# Patient Record
Sex: Male | Born: 1950 | Race: White | Hispanic: No | Marital: Married | Smoking: Never smoker
Health system: Southern US, Community
[De-identification: ages and names within clinical notes are randomized; demographics above are authoritative.]

## PROBLEM LIST (undated history)

## (undated) DIAGNOSIS — C801 Malignant (primary) neoplasm, unspecified: Secondary | ICD-10-CM

## (undated) DIAGNOSIS — K219 Gastro-esophageal reflux disease without esophagitis: Secondary | ICD-10-CM

## (undated) DIAGNOSIS — E785 Hyperlipidemia, unspecified: Secondary | ICD-10-CM

## (undated) DIAGNOSIS — G4710 Hypersomnia, unspecified: Secondary | ICD-10-CM

## (undated) DIAGNOSIS — G709 Myoneural disorder, unspecified: Secondary | ICD-10-CM

## (undated) DIAGNOSIS — K625 Hemorrhage of anus and rectum: Secondary | ICD-10-CM

## (undated) DIAGNOSIS — G4730 Sleep apnea, unspecified: Secondary | ICD-10-CM

## (undated) DIAGNOSIS — M76899 Other specified enthesopathies of unspecified lower limb, excluding foot: Secondary | ICD-10-CM

## (undated) DIAGNOSIS — D649 Anemia, unspecified: Secondary | ICD-10-CM

## (undated) DIAGNOSIS — M545 Low back pain, unspecified: Secondary | ICD-10-CM

## (undated) HISTORY — PX: LUMBAR DISC SURGERY: SHX700

## (undated) HISTORY — PX: CARDIAC CATHETERIZATION: SHX172

## (undated) HISTORY — PX: APPENDECTOMY: SHX54

## (undated) HISTORY — PX: EPIDIDYMECTOMY: SHX478

## (undated) HISTORY — PX: OTHER SURGICAL HISTORY: SHX169

## (undated) HISTORY — PX: LUMBAR FUSION: SHX111

## (undated) HISTORY — PX: HERNIA REPAIR: SHX51

## (undated) HISTORY — PX: KNEE ARTHROSCOPY: SUR90

## (undated) HISTORY — PX: TONSILLECTOMY: SUR1361

## (undated) HISTORY — PX: VASECTOMY: SHX75

---

## 2004-04-06 ENCOUNTER — Other Ambulatory Visit: Payer: Self-pay

## 2004-05-26 ENCOUNTER — Ambulatory Visit: Payer: Self-pay | Admitting: General Practice

## 2004-06-14 ENCOUNTER — Ambulatory Visit: Payer: Self-pay | Admitting: General Practice

## 2005-06-08 ENCOUNTER — Emergency Department: Payer: Self-pay | Admitting: Emergency Medicine

## 2005-10-19 ENCOUNTER — Other Ambulatory Visit: Payer: Self-pay

## 2005-10-19 ENCOUNTER — Emergency Department: Payer: Self-pay | Admitting: Emergency Medicine

## 2006-09-11 ENCOUNTER — Ambulatory Visit: Payer: Self-pay | Admitting: Gastroenterology

## 2006-09-12 ENCOUNTER — Ambulatory Visit: Payer: Self-pay | Admitting: Gastroenterology

## 2009-02-03 ENCOUNTER — Encounter: Admission: RE | Admit: 2009-02-03 | Discharge: 2009-02-03 | Payer: Self-pay | Admitting: Neurology

## 2009-05-14 ENCOUNTER — Ambulatory Visit: Payer: Self-pay | Admitting: Anesthesiology

## 2009-05-25 ENCOUNTER — Ambulatory Visit: Payer: Self-pay | Admitting: Physician Assistant

## 2009-05-31 ENCOUNTER — Encounter: Payer: Self-pay | Admitting: Anesthesiology

## 2009-06-02 ENCOUNTER — Ambulatory Visit: Payer: Self-pay | Admitting: Anesthesiology

## 2009-06-04 ENCOUNTER — Ambulatory Visit: Payer: Self-pay | Admitting: Physician Assistant

## 2009-06-21 ENCOUNTER — Encounter: Payer: Self-pay | Admitting: Anesthesiology

## 2009-06-23 ENCOUNTER — Ambulatory Visit: Payer: Self-pay | Admitting: Anesthesiology

## 2009-07-07 ENCOUNTER — Ambulatory Visit: Payer: Self-pay | Admitting: Anesthesiology

## 2009-09-01 ENCOUNTER — Ambulatory Visit: Payer: Self-pay | Admitting: Orthopedic Surgery

## 2009-09-09 ENCOUNTER — Ambulatory Visit: Payer: Self-pay | Admitting: Anesthesiology

## 2009-11-02 ENCOUNTER — Ambulatory Visit: Payer: Self-pay | Admitting: Anesthesiology

## 2009-11-17 ENCOUNTER — Ambulatory Visit: Payer: Self-pay | Admitting: Pain Medicine

## 2009-12-16 ENCOUNTER — Ambulatory Visit: Payer: Self-pay | Admitting: Anesthesiology

## 2010-02-14 ENCOUNTER — Ambulatory Visit: Payer: Self-pay | Admitting: Pain Medicine

## 2010-03-07 ENCOUNTER — Ambulatory Visit: Payer: Self-pay | Admitting: Gastroenterology

## 2010-03-08 ENCOUNTER — Ambulatory Visit: Payer: Self-pay | Admitting: Gastroenterology

## 2010-03-09 LAB — PATHOLOGY REPORT

## 2010-03-22 ENCOUNTER — Other Ambulatory Visit: Payer: Self-pay | Admitting: Nurse Practitioner

## 2010-04-18 ENCOUNTER — Ambulatory Visit: Payer: Self-pay | Admitting: Gastroenterology

## 2010-05-13 ENCOUNTER — Ambulatory Visit: Payer: Self-pay | Admitting: Urology

## 2010-05-16 ENCOUNTER — Ambulatory Visit: Payer: Self-pay | Admitting: Urology

## 2010-05-26 ENCOUNTER — Ambulatory Visit: Payer: Self-pay | Admitting: Urology

## 2010-06-07 ENCOUNTER — Inpatient Hospital Stay: Payer: Self-pay | Admitting: Urology

## 2010-08-31 ENCOUNTER — Ambulatory Visit: Payer: Self-pay | Admitting: Radiation Oncology

## 2010-09-21 ENCOUNTER — Ambulatory Visit: Payer: Self-pay | Admitting: Radiation Oncology

## 2010-10-20 ENCOUNTER — Ambulatory Visit: Payer: Self-pay | Admitting: Radiation Oncology

## 2010-11-20 ENCOUNTER — Ambulatory Visit: Payer: Self-pay | Admitting: Radiation Oncology

## 2011-04-10 ENCOUNTER — Ambulatory Visit: Payer: Self-pay | Admitting: Radiation Oncology

## 2011-04-22 ENCOUNTER — Ambulatory Visit: Payer: Self-pay | Admitting: Radiation Oncology

## 2011-07-27 ENCOUNTER — Ambulatory Visit: Payer: Self-pay

## 2012-04-10 ENCOUNTER — Ambulatory Visit: Payer: Self-pay | Admitting: Radiation Oncology

## 2012-04-10 LAB — URINALYSIS, COMPLETE
Bacteria: NONE SEEN
Glucose,UR: NEGATIVE mg/dL (ref 0–75)
Leukocyte Esterase: NEGATIVE
Nitrite: NEGATIVE
Specific Gravity: 1.009 (ref 1.003–1.030)
WBC UR: NONE SEEN /HPF (ref 0–5)

## 2012-04-21 ENCOUNTER — Ambulatory Visit: Payer: Self-pay | Admitting: Radiation Oncology

## 2013-04-10 ENCOUNTER — Ambulatory Visit: Payer: Self-pay | Admitting: Radiation Oncology

## 2013-04-21 ENCOUNTER — Ambulatory Visit: Payer: Self-pay | Admitting: Radiation Oncology

## 2014-04-09 ENCOUNTER — Ambulatory Visit: Payer: Self-pay | Admitting: Radiation Oncology

## 2014-04-21 ENCOUNTER — Ambulatory Visit: Payer: Self-pay | Admitting: Radiation Oncology

## 2014-11-30 ENCOUNTER — Ambulatory Visit
Admit: 2014-11-30 | Disposition: A | Discharge status: Home / Self Care | Payer: Self-pay | Attending: Radiation Oncology | Admitting: Radiation Oncology

## 2015-05-28 ENCOUNTER — Encounter: Payer: Self-pay | Admitting: *Deleted

## 2015-05-31 ENCOUNTER — Encounter
Admission: RE | Disposition: A | Discharge status: Home / Self Care | Payer: Self-pay | Source: Ambulatory Visit | Attending: Gastroenterology

## 2015-05-31 ENCOUNTER — Ambulatory Visit: Payer: Self-pay | Admitting: Radiation Oncology

## 2015-05-31 ENCOUNTER — Encounter: Payer: Self-pay | Admitting: *Deleted

## 2015-05-31 ENCOUNTER — Ambulatory Visit
Admission: RE | Admit: 2015-05-31 | Discharge: 2015-05-31 | Disposition: A | Discharge status: Home / Self Care | Payer: Medicare Other | Source: Ambulatory Visit | Attending: Gastroenterology | Admitting: Gastroenterology

## 2015-05-31 ENCOUNTER — Ambulatory Visit: Payer: Medicare Other | Admitting: Certified Registered Nurse Anesthetist

## 2015-05-31 DIAGNOSIS — K2950 Unspecified chronic gastritis without bleeding: Secondary | ICD-10-CM | POA: Diagnosis not present

## 2015-05-31 DIAGNOSIS — G629 Polyneuropathy, unspecified: Secondary | ICD-10-CM | POA: Insufficient documentation

## 2015-05-31 DIAGNOSIS — G709 Myoneural disorder, unspecified: Secondary | ICD-10-CM | POA: Insufficient documentation

## 2015-05-31 DIAGNOSIS — Z79899 Other long term (current) drug therapy: Secondary | ICD-10-CM | POA: Insufficient documentation

## 2015-05-31 DIAGNOSIS — D649 Anemia, unspecified: Secondary | ICD-10-CM | POA: Insufficient documentation

## 2015-05-31 DIAGNOSIS — Z9109 Other allergy status, other than to drugs and biological substances: Secondary | ICD-10-CM | POA: Insufficient documentation

## 2015-05-31 DIAGNOSIS — M769 Unspecified enthesopathy, lower limb, excluding foot: Secondary | ICD-10-CM | POA: Diagnosis not present

## 2015-05-31 DIAGNOSIS — Z800 Family history of malignant neoplasm of digestive organs: Secondary | ICD-10-CM | POA: Diagnosis not present

## 2015-05-31 DIAGNOSIS — Z1211 Encounter for screening for malignant neoplasm of colon: Secondary | ICD-10-CM | POA: Insufficient documentation

## 2015-05-31 DIAGNOSIS — K219 Gastro-esophageal reflux disease without esophagitis: Secondary | ICD-10-CM | POA: Insufficient documentation

## 2015-05-31 DIAGNOSIS — Z881 Allergy status to other antibiotic agents status: Secondary | ICD-10-CM | POA: Diagnosis not present

## 2015-05-31 DIAGNOSIS — G4730 Sleep apnea, unspecified: Secondary | ICD-10-CM | POA: Insufficient documentation

## 2015-05-31 DIAGNOSIS — R1013 Epigastric pain: Secondary | ICD-10-CM | POA: Diagnosis present

## 2015-05-31 DIAGNOSIS — E785 Hyperlipidemia, unspecified: Secondary | ICD-10-CM | POA: Insufficient documentation

## 2015-05-31 HISTORY — DX: Hyperlipidemia, unspecified: E78.5

## 2015-05-31 HISTORY — PX: COLONOSCOPY WITH PROPOFOL: SHX5780

## 2015-05-31 HISTORY — DX: Hemorrhage of anus and rectum: K62.5

## 2015-05-31 HISTORY — PX: ESOPHAGOGASTRODUODENOSCOPY (EGD) WITH PROPOFOL: SHX5813

## 2015-05-31 HISTORY — DX: Sleep apnea, unspecified: G47.30

## 2015-05-31 HISTORY — DX: Gastro-esophageal reflux disease without esophagitis: K21.9

## 2015-05-31 HISTORY — DX: Hypersomnia, unspecified: G47.10

## 2015-05-31 HISTORY — DX: Low back pain, unspecified: M54.50

## 2015-05-31 HISTORY — DX: Myoneural disorder, unspecified: G70.9

## 2015-05-31 HISTORY — DX: Anemia, unspecified: D64.9

## 2015-05-31 HISTORY — DX: Other specified enthesopathies of unspecified lower limb, excluding foot: M76.899

## 2015-05-31 HISTORY — DX: Low back pain: M54.5

## 2015-05-31 SURGERY — COLONOSCOPY WITH PROPOFOL
Anesthesia: General

## 2015-05-31 MED ORDER — FENTANYL CITRATE (PF) 100 MCG/2ML IJ SOLN
INTRAMUSCULAR | Status: DC | PRN
  Administered 2015-05-31: 100 ug via INTRAVENOUS

## 2015-05-31 MED ORDER — PROPOFOL 10 MG/ML IV BOLUS
INTRAVENOUS | Status: DC | PRN
  Administered 2015-05-31: 30 mg via INTRAVENOUS

## 2015-05-31 MED ORDER — PROPOFOL 500 MG/50ML IV EMUL
INTRAVENOUS | Status: DC | PRN
  Administered 2015-05-31: 120 ug/kg/min via INTRAVENOUS

## 2015-05-31 MED ORDER — LIDOCAINE HCL (CARDIAC) 20 MG/ML IV SOLN
INTRAVENOUS | Status: DC | PRN
  Administered 2015-05-31: 100 mg via INTRAVENOUS

## 2015-05-31 MED ORDER — GLYCOPYRROLATE 0.2 MG/ML IJ SOLN
INTRAMUSCULAR | Status: DC | PRN
  Administered 2015-05-31: 0.2 mg via INTRAVENOUS

## 2015-05-31 MED ORDER — MIDAZOLAM HCL 2 MG/2ML IJ SOLN
INTRAMUSCULAR | Status: DC | PRN
  Administered 2015-05-31: 1 mg via INTRAVENOUS

## 2015-05-31 MED ORDER — SODIUM CHLORIDE 0.9 % IV SOLN
INTRAVENOUS | Status: DC
  Administered 2015-05-31: 13:00:00 via INTRAVENOUS

## 2015-05-31 NOTE — H&P (Signed)
Outpatient short stay form Pre-procedure 05/31/2015 1:03 PM [name] MD  Primary Physician: Dr [name]  Reason for visit:  EGD and colonoscopy  History of present illness:  Patient is presenting with some symptoms of dyspepsia however he has been taking Nexium orally milligrams daily. Most of his symptoms are dietary related. There is also a family history of colon cancer and a primary relative. His colonoscopy was about 5 years ago. He tolerated his prep well although there is some mild discomfort in the right lower quadrant. He takes no aspirin, blood thinners or other aspirin products.    Current facility-administered medications:  .  0.9 %  sodium chloride infusion, , Intravenous, Continuous, [name], MD  Prescriptions prior to admission  Medication Sig Dispense Refill Last Dose  . acetaminophen (TYLENOL) 500 MG tablet Take 500 mg by mouth every 6 (six) hours as needed.   Past Week at Unknown time  . acyclovir (ZOVIRAX) 200 MG capsule Take 200 mg by mouth 5 (five) times daily.   Past Week at Unknown time  . albuterol (PROVENTIL HFA;VENTOLIN HFA) 108 (90 BASE) MCG/ACT inhaler Inhale 2 puffs into the lungs every 6 (six) hours as needed for wheezing or shortness of breath.   Past Week at Unknown time  . albuterol-ipratropium (COMBIVENT) 18-103 MCG/ACT inhaler Inhale 2 puffs into the lungs every 4 (four) hours as needed for wheezing or shortness of breath.   05/30/2015 at Unknown time  . BLACK ELDERBERRY,BERRY-FLOWER, PO Take 7.5 mLs by mouth daily.   Past Week at Unknown time  . budesonide-formoterol (SYMBICORT) 160-4.5 MCG/ACT inhaler Inhale 2 puffs into the lungs 2 (two) times daily.   05/30/2015 at Unknown time  . calcium carbonate (TUMS) 500 MG chewable tablet Chew 1 tablet by mouth daily.   Past Week at Unknown time  . Coconut Oil 1000 MG CAPS Take 1 capsule by mouth daily.   Past Week at Unknown time  . docusate sodium (COLACE) 100 MG capsule Take 100 mg by  mouth 2 (two) times daily.   Past Week at Unknown time  . EPINEPHrine (EPIPEN 2-PAK) 0.3 mg/0.3 mL IJ SOAJ injection Inject 0.3 mg into the muscle once.     Marland Kitchen esomeprazole (NEXIUM) 40 MG capsule Take 40 mg by mouth daily at 12 noon.   Past Week at Unknown time  . fentaNYL (DURAGESIC - DOSED MCG/HR) 100 MCG/HR Place 100 mcg onto the skin every 3 (three) days.   05/30/2015 at Unknown time  . FISH OIL-BORAGE-FLAX-SAFFLOWER PO Take 1 tablet by mouth daily.   Past Week at Unknown time  . flavoxATE (URISPAS) 100 MG tablet Take 100 mg by mouth daily.   Past Week at Unknown time  . Garlic 8502 MG CAPS Take 1,000 mg by mouth daily.   Past Week at Unknown time  . glucosamine-chondroitin 500-400 MG tablet Take 1 tablet by mouth 3 (three) times daily.   Past Week at Unknown time  . HYDROcodone-acetaminophen (NORCO) 7.5-325 MG tablet Take 1 tablet by mouth every 6 (six) hours as needed for moderate pain.   05/30/2015 at Unknown time  . hydrOXYzine (ATARAX/VISTARIL) 25 MG tablet Take 25 mg by mouth every 8 (eight) hours as needed.   Past Week at Unknown time  . leuprolide (LUPRON) 1 MG/0.2ML injection Inject 1 mg into the skin daily.   Past Month at Unknown time  . methocarbamol (ROBAXIN) 750 MG tablet Take 750 mg by mouth 3 (three) times daily.   Past Week  at Unknown time  . Methylcellulose, Laxative, (CITRUCEL) 500 MG TABS Take 1 tablet by mouth daily.   Past Week at Unknown time  . mometasone (NASONEX) 50 MCG/ACT nasal spray Place 2 sprays into the nose daily.   Past Week at Unknown time  . montelukast (SINGULAIR) 10 MG tablet Take 10 mg by mouth at bedtime.   Past Week at Unknown time  . Multiple Vitamin (MULTIVITAMIN) tablet Take 1 tablet by mouth daily.   Past Week at Unknown time     Allergies  Allergen Reactions  . Amoxicillin   . Marybelle Killings Hcl]   . Baclofen   . Cyclobenzaprine   . Darvon [Propoxyphene]   . Dymista [Azelastine-Fluticasone]   . Gluten Meal   . Lidoderm [Lidocaine]   .  Lipitor [Atorvastatin]   . Lyrica [Pregabalin]   . Niaspan [Niacin Er]   . Tricor [Fenofibrate]   . Ultram [Tramadol Hcl]   . Delton See [Denosumab]      Past Medical History  Diagnosis Date  . Anemia   . Neuromuscular disorder (HCC)     Peripheral Neuropathy  . Hyperlipidemia   . Hypersomnia   . Sleep apnea   . Enthesopathy of hip region   . Lumbago   . GERD (gastroesophageal reflux disease)   . Hemorrhage of rectum and anus     Review of systems:      Physical Exam    Heart and lungs: Regular rate and rhythm without rub or gallop, lungs are bilaterally clear    HEENT: Normocephalic atraumatic eyes are anicteric    Other:     Pertinant exam for procedure: Soft mild discomfort palpation in the right lower quadrant. No masses rebound or organomegaly. Bowel sounds are positive normoactive.    Planned proceedures: EGD and colonoscopy with indicated procedures I have discussed the risks benefits and complications of procedures to include not limited to bleeding, infection, perforation and the risk of sedation and the patient wishes to proceed.    [name], MD Gastroenterology 05/31/2015  1:03 PM

## 2015-05-31 NOTE — Transfer of Care (Signed)
Immediate Anesthesia Transfer of Care Note  Patient: Aaron Ferrell  Procedure(s) Performed: Procedure(s): COLONOSCOPY WITH PROPOFOL (N/A) ESOPHAGOGASTRODUODENOSCOPY (EGD) WITH PROPOFOL (N/A)  Patient Location: PACU  Anesthesia Type:General  Level of Consciousness: sedated  Airway & Oxygen Therapy: Patient Spontanous Breathing and Patient connected to nasal cannula oxygen  Post-op Assessment: Report given to RN and Post -op Vital signs reviewed and stable  Post vital signs: Reviewed and stable  Last Vitals:  Filed Vitals:   05/31/15 1246  BP: 138/73  Pulse: 74  Temp: 36.6 C  Resp: 20    Complications: No apparent anesthesia complications

## 2015-05-31 NOTE — Op Note (Signed)
[facility] Gastroenterology Patient Name: Aaron Ferrell Procedure Date: 05/31/2015 1:13 PM MRN: [id number] Account #: [id number] Date of Birth: May 04, 1951 Admit Type: Outpatient Age: 64 Room: [facility] ENDO ROOM 4 Gender: Male Note Status: Finalized Procedure:         Upper GI endoscopy Indications:       Dyspepsia Providers:         [name], MD Referring MD:      Irven Easterly. Kary Kos, MD (Referring MD) Medicines:         Monitored Anesthesia Care Complications:     No immediate complications. Procedure:         Pre-Anesthesia Assessment:                    - ASA Grade Assessment: III - A patient with severe                     systemic disease.                    After obtaining informed consent, the endoscope was passed                     under direct vision. Throughout the procedure, the                     patient's blood pressure, pulse, and oxygen saturations                     were monitored continuously. The Endoscope was introduced                     through the mouth, and advanced to the third part of                     duodenum. The upper GI endoscopy was accomplished without                     difficulty. The patient tolerated the procedure well. Findings:      The Z-line was variable. Biopsies were taken with a cold forceps for       histology.      The lower third of the esophagus was moderately tortuous.      Diffuse mild inflammation characterized by congestion (edema) and       erythema was found in the gastric body. Biopsies were taken with a cold       forceps for histology.      Patchy mild inflammation characterized by congestion (edema) was found       in the gastric antrum. Biopsies were taken with a cold forceps for       histology.      The cardia and gastric fundus were normal on retroflexion.      The examined duodenum was normal. Impression:        - Z-line variable. Biopsied.                    - Tortuous esophagus.                 - Gastritis. Biopsied.                    - Gastritis. Biopsied.                    - Normal examined duodenum.  Recommendation:    - Await pathology results.                    - Continue present medications. Procedure Code(s): --- Professional ---                    [phone], Esophagogastroduodenoscopy, flexible, transoral;                     with biopsy, single or multiple Diagnosis Code(s): --- Professional ---                    530.89, Other specified disorders of esophagus                    750.4, Other specified anomalies of esophagus                    535.50, Unspecified gastritis and gastroduodenitis,                     without mention of hemorrhage                    536.8, Dyspepsia and other specified disorders of function                     of stomach CPT copyright 2014 American Medical Association. All rights reserved. The codes documented in this report are preliminary and upon coder review may  be revised to meet current compliance requirements. [name], MD 05/31/2015 1:32:49 PM This report has been signed electronically. Number of Addenda: 0 Note Initiated On: 05/31/2015 1:13 PM      [facility]

## 2015-05-31 NOTE — Op Note (Signed)
[facility] Gastroenterology Patient Name: Aaron Ferrell Procedure Date: 05/31/2015 1:12 PM MRN: [id number] Account #: [id number] Date of Birth: [birth year] Admit Type: Outpatient Age: 64 Room: [facility] ENDO ROOM 4 Gender: Male Note Status: Finalized Procedure:         Colonoscopy Providers:         [name], MD Referring MD:      Irven Easterly. Kary Kos, MD (Referring MD) Medicines:         Monitored Anesthesia Care Complications:     No immediate complications. Procedure:         Pre-Anesthesia Assessment:                    - ASA Grade Assessment: III - A patient with severe                     systemic disease.                    After obtaining informed consent, the colonoscope was                     passed under direct vision. Throughout the procedure, the                     patient's blood pressure, pulse, and oxygen saturations                     were monitored continuously. The Colonoscope was                     introduced through the anus with the intention of                     advancing to the cecum. The scope was advanced to the                     descending colon before the procedure was aborted.                     Medications were given. The colonoscopy was unusually                     difficult due to poor bowel prep with stool present. The                     patient tolerated the procedure well. The quality of the                     bowel preparation was poor. Findings:      A large amount of semi-liquid stool was found in the sigmoid colon and       in the descending colon, precluding visualization. Unable to suction to       remove material due to formed debris.      The digital rectal exam was normal. Impression:        - Preparation of the colon was poor.                    - Stool in the sigmoid colon and in the descending colon.                    - No specimens collected. Recommendation:    - repeat prep, reschedule to  tomorrow. Procedure Code(s): --- Professional ---  83779, Sigmoidoscopy, flexible; diagnostic, including                     collection of specimen(s) by brushing or washing, when                     performed (separate procedure) CPT copyright 2014 American Medical Association. All rights reserved. The codes documented in this report are preliminary and upon coder review may  be revised to meet current compliance requirements. [name], MD 05/31/2015 1:48:34 PM This report has been signed electronically. Number of Addenda: 0 Note Initiated On: 05/31/2015 1:12 PM Total Procedure Duration: 0 hours 5 minutes 44 seconds       [facility]

## 2015-05-31 NOTE — Anesthesia Preprocedure Evaluation (Signed)
Anesthesia Evaluation  Patient identified by MRN, date of birth, ID band Patient awake    Reviewed: Allergy & Precautions, H&P , NPO status , Patient's Chart, lab work & pertinent test results  Airway Mallampati: III  TM Distance: >3 FB Neck ROM: limited    Dental  (+) Poor Dentition   Pulmonary sleep apnea and Continuous Positive Airway Pressure Ventilation ,    Pulmonary exam normal breath sounds clear to auscultation       Cardiovascular Exercise Tolerance: Good negative cardio ROS Normal cardiovascular exam(-) Valvular Problems/Murmurs Rhythm:regular Rate:Normal     Neuro/Psych  Neuromuscular disease negative psych ROS   GI/Hepatic Neg liver ROS, GERD  Controlled,  Endo/Other  negative endocrine ROS  Renal/GU negative Renal ROS  negative genitourinary   Musculoskeletal   Abdominal   Peds  Hematology negative hematology ROS (+)   Anesthesia Other Findings Past Medical History:   Anemia                                                       Neuromuscular disorder (HCC)                                   Comment:Peripheral Neuropathy   Hyperlipidemia                                               Hypersomnia                                                  Sleep apnea                                                  Enthesopathy of hip region                                   Lumbago                                                      GERD (gastroesophageal reflux disease)                       Hemorrhage of rectum and anus                               BMI    Body Mass Index   30.06 kg/m 2      Reproductive/Obstetrics negative OB ROS  Anesthesia Physical Anesthesia Plan  ASA: III  Anesthesia Plan: General   Post-op Pain Management:    Induction:   Airway Management Planned:   Additional Equipment:   Intra-op Plan:   Post-operative Plan:    Informed Consent: I have reviewed the patients History and Physical, chart, labs and discussed the procedure including the risks, benefits and alternatives for the proposed anesthesia with the patient or authorized representative who has indicated his/her understanding and acceptance.   Dental Advisory Given  Plan Discussed with: Anesthesiologist, CRNA and Surgeon  Anesthesia Plan Comments:         Anesthesia Quick Evaluation

## 2015-05-31 NOTE — Anesthesia Postprocedure Evaluation (Signed)
  Anesthesia Post-op Note  Patient: Aaron Ferrell  Procedure(s) Performed: Procedure(s): COLONOSCOPY WITH PROPOFOL (N/A) ESOPHAGOGASTRODUODENOSCOPY (EGD) WITH PROPOFOL (N/A)  Anesthesia type:General  Patient location: PACU  Post pain: Pain level controlled  Post assessment: Post-op Vital signs reviewed, Patient's Cardiovascular Status Stable, Respiratory Function Stable, Patent Airway and No signs of Nausea or vomiting  Post vital signs: Reviewed and stable  Last Vitals:  Filed Vitals:   05/31/15 1420  BP: 139/78  Pulse: 61  Temp:   Resp: 11    Level of consciousness: awake, alert  and patient cooperative  Complications: No apparent anesthesia complications

## 2015-05-31 NOTE — Anesthesia Procedure Notes (Signed)
Performed by: Zackaria Burkey Pre-anesthesia Checklist: Patient identified, Emergency Drugs available, Patient being monitored, Suction available and Timeout performed Patient Re-evaluated:Patient Re-evaluated prior to inductionOxygen Delivery Method: Nasal cannula Intubation Type: IV induction       

## 2015-06-01 ENCOUNTER — Encounter
Admission: RE | Disposition: A | Discharge status: Home / Self Care | Payer: Self-pay | Source: Ambulatory Visit | Attending: Gastroenterology

## 2015-06-01 ENCOUNTER — Ambulatory Visit: Payer: Medicare Other | Admitting: Anesthesiology

## 2015-06-01 ENCOUNTER — Ambulatory Visit
Admission: RE | Admit: 2015-06-01 | Discharge: 2015-06-01 | Disposition: A | Discharge status: Home / Self Care | Payer: Medicare Other | Source: Ambulatory Visit | Attending: Gastroenterology | Admitting: Gastroenterology

## 2015-06-01 ENCOUNTER — Encounter: Payer: Self-pay | Admitting: *Deleted

## 2015-06-01 DIAGNOSIS — E785 Hyperlipidemia, unspecified: Secondary | ICD-10-CM | POA: Diagnosis not present

## 2015-06-01 DIAGNOSIS — Z1211 Encounter for screening for malignant neoplasm of colon: Secondary | ICD-10-CM | POA: Diagnosis present

## 2015-06-01 DIAGNOSIS — G709 Myoneural disorder, unspecified: Secondary | ICD-10-CM | POA: Insufficient documentation

## 2015-06-01 DIAGNOSIS — Z9109 Other allergy status, other than to drugs and biological substances: Secondary | ICD-10-CM | POA: Diagnosis not present

## 2015-06-01 DIAGNOSIS — K219 Gastro-esophageal reflux disease without esophagitis: Secondary | ICD-10-CM | POA: Diagnosis not present

## 2015-06-01 DIAGNOSIS — K5730 Diverticulosis of large intestine without perforation or abscess without bleeding: Secondary | ICD-10-CM | POA: Insufficient documentation

## 2015-06-01 DIAGNOSIS — Z886 Allergy status to analgesic agent status: Secondary | ICD-10-CM | POA: Insufficient documentation

## 2015-06-01 DIAGNOSIS — G4730 Sleep apnea, unspecified: Secondary | ICD-10-CM | POA: Insufficient documentation

## 2015-06-01 DIAGNOSIS — Z79899 Other long term (current) drug therapy: Secondary | ICD-10-CM | POA: Insufficient documentation

## 2015-06-01 DIAGNOSIS — G629 Polyneuropathy, unspecified: Secondary | ICD-10-CM | POA: Insufficient documentation

## 2015-06-01 DIAGNOSIS — Z800 Family history of malignant neoplasm of digestive organs: Secondary | ICD-10-CM | POA: Insufficient documentation

## 2015-06-01 DIAGNOSIS — G4710 Hypersomnia, unspecified: Secondary | ICD-10-CM | POA: Diagnosis not present

## 2015-06-01 DIAGNOSIS — M769 Unspecified enthesopathy, lower limb, excluding foot: Secondary | ICD-10-CM | POA: Diagnosis not present

## 2015-06-01 DIAGNOSIS — Z79891 Long term (current) use of opiate analgesic: Secondary | ICD-10-CM | POA: Diagnosis not present

## 2015-06-01 DIAGNOSIS — D649 Anemia, unspecified: Secondary | ICD-10-CM | POA: Insufficient documentation

## 2015-06-01 DIAGNOSIS — Z881 Allergy status to other antibiotic agents status: Secondary | ICD-10-CM | POA: Insufficient documentation

## 2015-06-01 DIAGNOSIS — Z7951 Long term (current) use of inhaled steroids: Secondary | ICD-10-CM | POA: Insufficient documentation

## 2015-06-01 DIAGNOSIS — D125 Benign neoplasm of sigmoid colon: Secondary | ICD-10-CM | POA: Insufficient documentation

## 2015-06-01 HISTORY — PX: COLONOSCOPY WITH PROPOFOL: SHX5780

## 2015-06-01 SURGERY — COLONOSCOPY WITH PROPOFOL
Anesthesia: General

## 2015-06-01 MED ORDER — SODIUM CHLORIDE 0.9 % IV SOLN
INTRAVENOUS | Status: DC
  Administered 2015-06-01: 15:00:00 via INTRAVENOUS

## 2015-06-01 MED ORDER — LIDOCAINE HCL (CARDIAC) 20 MG/ML IV SOLN
INTRAVENOUS | Status: DC | PRN
Start: 2015-06-01 — End: 2015-06-01
  Administered 2015-06-01: 30 mg via INTRAVENOUS

## 2015-06-01 MED ORDER — PROPOFOL 500 MG/50ML IV EMUL
INTRAVENOUS | Status: DC | PRN
  Administered 2015-06-01: 140 ug/kg/min via INTRAVENOUS

## 2015-06-01 MED ORDER — SODIUM CHLORIDE 0.9 % IV SOLN: INTRAVENOUS | Status: DC

## 2015-06-01 MED ORDER — EPHEDRINE SULFATE 50 MG/ML IJ SOLN
INTRAMUSCULAR | Status: DC | PRN
  Administered 2015-06-01: 10 mg via INTRAVENOUS

## 2015-06-01 MED ORDER — MIDAZOLAM HCL 5 MG/5ML IJ SOLN
INTRAMUSCULAR | Status: DC | PRN
  Administered 2015-06-01: 1 mg via INTRAVENOUS

## 2015-06-01 MED ORDER — FENTANYL CITRATE (PF) 100 MCG/2ML IJ SOLN
INTRAMUSCULAR | Status: DC | PRN
  Administered 2015-06-01: 50 ug via INTRAVENOUS

## 2015-06-01 MED ORDER — SODIUM CHLORIDE 0.9 % IV SOLN
INTRAVENOUS | Status: DC
  Administered 2015-06-01: 1000 mL via INTRAVENOUS

## 2015-06-01 NOTE — Anesthesia Preprocedure Evaluation (Signed)
Anesthesia Evaluation  Patient identified by MRN, date of birth, ID band Patient awake    Reviewed: Allergy & Precautions, H&P , NPO status , Patient's Chart, lab work & pertinent test results  History of Anesthesia Complications Negative for: history of anesthetic complications  Airway Mallampati: III  TM Distance: >3 FB Neck ROM: limited    Dental  (+) Poor Dentition   Pulmonary sleep apnea and Continuous Positive Airway Pressure Ventilation ,    Pulmonary exam normal breath sounds clear to auscultation       Cardiovascular Exercise Tolerance: Good negative cardio ROS Normal cardiovascular exam(-) Valvular Problems/Murmurs Rhythm:regular Rate:Normal     Neuro/Psych  Neuromuscular disease negative psych ROS   GI/Hepatic Neg liver ROS, GERD  Controlled,  Endo/Other  negative endocrine ROS  Renal/GU negative Renal ROS  negative genitourinary   Musculoskeletal   Abdominal   Peds  Hematology negative hematology ROS (+) anemia ,   Anesthesia Other Findings Past Medical History:   Anemia                                                       Neuromuscular disorder (HCC)                                   Comment:Peripheral Neuropathy   Hyperlipidemia                                               Hypersomnia                                                  Sleep apnea                                                  Enthesopathy of hip region                                   Lumbago                                                      GERD (gastroesophageal reflux disease)                       Hemorrhage of rectum and anus                               BMI    Body Mass Index   30.06 kg/m 2      Reproductive/Obstetrics negative OB ROS  Anesthesia Physical  Anesthesia Plan  ASA: III  Anesthesia Plan: General   Post-op Pain Management:    Induction:    Airway Management Planned:   Additional Equipment:   Intra-op Plan:   Post-operative Plan:   Informed Consent: I have reviewed the patients History and Physical, chart, labs and discussed the procedure including the risks, benefits and alternatives for the proposed anesthesia with the patient or authorized representative who has indicated his/her understanding and acceptance.   Dental Advisory Given  Plan Discussed with: Anesthesiologist, CRNA and Surgeon  Anesthesia Plan Comments:         Anesthesia Quick Evaluation

## 2015-06-01 NOTE — Op Note (Signed)
[facility] Gastroenterology Patient Name: Aaron Ferrell Procedure Date: 06/01/2015 2:17 PM MRN: [id number] Account #: [id number] Date of Birth: 09-18-1950 Admit Type: Outpatient Age: 64 Room: [facility] ENDO ROOM 3 Gender: Male Note Status: Finalized Procedure:         Colonoscopy Indications:       Family history of colon cancer in a first-degree relative Providers:         [name], MD Referring MD:      Irven Easterly. Kary Kos, MD (Referring MD) Medicines:         Monitored Anesthesia Care Complications:     No immediate complications. Procedure:         Pre-Anesthesia Assessment:                    - ASA Grade Assessment: III - A patient with severe                     systemic disease.                    After obtaining informed consent, the colonoscope was                     passed under direct vision. Throughout the procedure, the                     patient's blood pressure, pulse, and oxygen saturations                     were monitored continuously. The Colonoscope was                     introduced through the anus and advanced to the the cecum,                     identified by appendiceal orifice and ileocecal valve. The                     colonoscopy was performed with moderate difficulty due to                     poor bowel prep and a tortuous colon. Successful                     completion of the procedure was aided by changing the                     patient to a prone position, using manual pressure,                     applying abdominal pressure and lavage. The patient                     tolerated the procedure well. The quality of the bowel                     preparation was fair except the ascending colon was poor. Findings:      Two sessile polyps were found in the distal sigmoid colon. The polyps       were 3 to 4 mm in size. These polyps were removed with a cold biopsy       forceps. Resection and retrieval were complete.  Multiple medium-mouthed diverticula were found in the sigmoid colon, in  the descending colon and in the transverse colon.      The digital rectal exam was normal. Impression:        - Two 3 to 4 mm polyps in the distal sigmoid colon.                     Resected and retrieved.                    - Diverticulosis in the sigmoid colon, in the descending                     colon and in the transverse colon. Recommendation:    - Await pathology results.                    - Telephone GI clinic for pathology results in 1 week. Procedure Code(s): --- Professional ---                    [phone], Colonoscopy, flexible; with biopsy, single or                     multiple Diagnosis Code(s): --- Professional ---                    211.3, Benign neoplasm of colon                    V16.0, Family history of malignant neoplasm of                     gastrointestinal tract                    562.10, Diverticulosis of colon (without mention of                     hemorrhage) CPT copyright 2014 American Medical Association. All rights reserved. The codes documented in this report are preliminary and upon coder review may  be revised to meet current compliance requirements. [name], MD 06/01/2015 3:30:50 PM This report has been signed electronically. Number of Addenda: 0 Note Initiated On: 06/01/2015 2:17 PM Scope Withdrawal Time: 0 hours 11 minutes 15 seconds  Total Procedure Duration: 0 hours 35 minutes 47 seconds       [facility]

## 2015-06-01 NOTE — Anesthesia Postprocedure Evaluation (Signed)
  Anesthesia Post-op Note  Patient: Aaron Ferrell  Procedure(s) Performed: Procedure(s): COLONOSCOPY WITH PROPOFOL (N/A)  Anesthesia type:General  Patient location: PACU  Post pain: Pain level controlled  Post assessment: Post-op Vital signs reviewed, Patient's Cardiovascular Status Stable, Respiratory Function Stable, Patent Airway and No signs of Nausea or vomiting  Post vital signs: Reviewed and stable  Last Vitals:  Filed Vitals:   06/01/15 1600  BP: 123/57  Pulse: 62  Temp:   Resp: 14    Level of consciousness: awake, alert  and patient cooperative  Complications: No apparent anesthesia complications

## 2015-06-01 NOTE — H&P (Addendum)
Outpatient short stay form Pre-procedure 06/01/2015 2:30 PM Aaron Sails MD  Primary Physician: Dr. Maryland Ferrell  Reason for visit:  Colonoscopy  History of present illness:  Patient is a 64 year old male presenting today for a colonoscopy. He is asked to come in yesterday however his prep was not good and he needed repeat prep. He takes no aspirin blood thinners or other aspirin products. He takes no anticoagulation medications. He tolerated the re-prep well.   Current facility-administered medications:  .  0.9 %  sodium chloride infusion, , Intravenous, Continuous, Aaron Sails, MD .  0.9 %  sodium chloride infusion, , Intravenous, Continuous, Aaron Sails, MD, Last Rate: 20 mL/hr at 06/01/15 1417, 1,000 mL at 06/01/15 1417 .  0.9 %  sodium chloride infusion, , Intravenous, Continuous, Aaron Sails, MD  Prescriptions prior to admission  Medication Sig Dispense Refill Last Dose  . albuterol-ipratropium (COMBIVENT) 18-103 MCG/ACT inhaler Inhale 2 puffs into the lungs every 4 (four) hours as needed for wheezing or shortness of breath.   05/31/2015 at 900am  . fentaNYL (DURAGESIC - DOSED MCG/HR) 100 MCG/HR Place 100 mcg onto the skin every 3 (three) days.   06/01/2015 at Unknown time  . acetaminophen (TYLENOL) 500 MG tablet Take 500 mg by mouth every 6 (six) hours as needed.   Past Week at Unknown time  . acyclovir (ZOVIRAX) 200 MG capsule Take 200 mg by mouth 5 (five) times daily.   Past Week at Unknown time  . albuterol (PROVENTIL HFA;VENTOLIN HFA) 108 (90 BASE) MCG/ACT inhaler Inhale 2 puffs into the lungs every 6 (six) hours as needed for wheezing or shortness of breath.   Past Week at Unknown time  . BLACK ELDERBERRY,BERRY-FLOWER, PO Take 7.5 mLs by mouth daily.   Past Week at Unknown time  . budesonide-formoterol (SYMBICORT) 160-4.5 MCG/ACT inhaler Inhale 2 puffs into the lungs 2 (two) times daily.   05/30/2015 at Unknown time  . calcium carbonate (TUMS) 500 MG  chewable tablet Chew 1 tablet by mouth daily.   Past Week at Unknown time  . Coconut Oil 1000 MG CAPS Take 1 capsule by mouth daily.   Past Week at Unknown time  . docusate sodium (COLACE) 100 MG capsule Take 100 mg by mouth 2 (two) times daily.   Past Week at Unknown time  . EPINEPHrine (EPIPEN 2-PAK) 0.3 mg/0.3 mL IJ SOAJ injection Inject 0.3 mg into the muscle once.     Marland Kitchen esomeprazole (NEXIUM) 40 MG capsule Take 40 mg by mouth daily at 12 noon.   Past Week at Unknown time  . FISH OIL-BORAGE-FLAX-SAFFLOWER PO Take 1 tablet by mouth daily.   Past Week at Unknown time  . flavoxATE (URISPAS) 100 MG tablet Take 100 mg by mouth daily.   Past Week at Unknown time  . Garlic 6378 MG CAPS Take 1,000 mg by mouth daily.   Past Week at Unknown time  . glucosamine-chondroitin 500-400 MG tablet Take 1 tablet by mouth 3 (three) times daily.   Past Week at Unknown time  . HYDROcodone-acetaminophen (NORCO) 7.5-325 MG tablet Take 1 tablet by mouth every 6 (six) hours as needed for moderate pain.   05/30/2015 at Unknown time  . hydrOXYzine (ATARAX/VISTARIL) 25 MG tablet Take 25 mg by mouth every 8 (eight) hours as needed.   Past Week at Unknown time  . leuprolide (LUPRON) 1 MG/0.2ML injection Inject 1 mg into the skin daily.   Past Month at Unknown time  . methocarbamol (ROBAXIN)  750 MG tablet Take 750 mg by mouth 3 (three) times daily.   Past Week at Unknown time  . Methylcellulose, Laxative, (CITRUCEL) 500 MG TABS Take 1 tablet by mouth daily.   Past Week at Unknown time  . mometasone (NASONEX) 50 MCG/ACT nasal spray Place 2 sprays into the nose daily.   Past Week at Unknown time  . montelukast (SINGULAIR) 10 MG tablet Take 10 mg by mouth at bedtime.   Past Week at Unknown time  . Multiple Vitamin (MULTIVITAMIN) tablet Take 1 tablet by mouth daily.   Past Week at Unknown time     Allergies  Allergen Reactions  . Amoxicillin   . Marybelle Killings Hcl]   . Baclofen   . Cyclobenzaprine   . Darvon  [Propoxyphene]   . Dymista [Azelastine-Fluticasone]   . Gluten Meal   . Lidoderm [Lidocaine]   . Lipitor [Atorvastatin]   . Lyrica [Pregabalin]   . Niaspan [Niacin Er]   . Tricor [Fenofibrate]   . Ultram [Tramadol Hcl]   . Delton See [Denosumab]      Past Medical History  Diagnosis Date  . Anemia   . Neuromuscular disorder (HCC)     Peripheral Neuropathy  . Hyperlipidemia   . Hypersomnia   . Sleep apnea   . Enthesopathy of hip region   . Lumbago   . GERD (gastroesophageal reflux disease)   . Hemorrhage of rectum and anus     Review of systems:      Physical Exam    Heart and lungs: Regular rate and rhythm without rub or gallop, lungs are bilaterally clear    HEENT: Normocephalic atraumatic eyes are anicteric    Other:     Pertinant exam for procedure: Soft protuberant nontender bowel sounds are positive normoactive    Planned proceedures: Endoscopy and indicated procedures. I have discussed the risks benefits and complications of procedures to include not limited to bleeding, infection, perforation and the risk of sedation and the patient wishes to proceed.    Aaron Sails, MD Gastroenterology 06/01/2015  2:30 PM

## 2015-06-01 NOTE — Transfer of Care (Signed)
Immediate Anesthesia Transfer of Care Note  Patient: Aaron Ferrell  Procedure(s) Performed: Procedure(s): COLONOSCOPY WITH PROPOFOL (N/A)  Patient Location: PACU and Endoscopy Unit  Anesthesia Type:General  Level of Consciousness: sedated and responds to stimulation  Airway & Oxygen Therapy: Patient Spontanous Breathing and Patient connected to nasal cannula oxygen  Post-op Assessment: Report given to RN and Post -op Vital signs reviewed and stable  Post vital signs: Reviewed and stable  Last Vitals:  Filed Vitals:   06/01/15 1535  BP: 103/49  Pulse: 60  Temp: 36.3 C  Resp: 16    Complications: No apparent anesthesia complications

## 2015-06-02 LAB — SURGICAL PATHOLOGY

## 2015-06-03 ENCOUNTER — Other Ambulatory Visit: Payer: Self-pay | Admitting: *Deleted

## 2015-06-03 DIAGNOSIS — C61 Malignant neoplasm of prostate: Secondary | ICD-10-CM

## 2015-06-03 LAB — SURGICAL PATHOLOGY

## 2015-06-04 ENCOUNTER — Other Ambulatory Visit: Payer: Self-pay | Admitting: *Deleted

## 2015-06-04 ENCOUNTER — Encounter: Payer: Self-pay | Admitting: Radiation Oncology

## 2015-06-04 ENCOUNTER — Inpatient Hospital Stay: Payer: Medicare Other | Attending: Radiation Oncology

## 2015-06-04 ENCOUNTER — Ambulatory Visit
Admission: RE | Admit: 2015-06-04 | Discharge: 2015-06-04 | Disposition: A | Discharge status: Home / Self Care | Payer: Medicare Other | Source: Ambulatory Visit | Attending: Radiation Oncology | Admitting: Radiation Oncology

## 2015-06-04 VITALS — BP 129/74 | HR 79 | Temp 98.0°F | Wt 195.3 lb

## 2015-06-04 DIAGNOSIS — C61 Malignant neoplasm of prostate: Secondary | ICD-10-CM | POA: Insufficient documentation

## 2015-06-04 DIAGNOSIS — C7951 Secondary malignant neoplasm of bone: Secondary | ICD-10-CM | POA: Diagnosis not present

## 2015-06-04 LAB — PSA: PSA: 0.22 ng/mL (ref 0.00–4.00)

## 2015-06-04 NOTE — Progress Notes (Signed)
Radiation Oncology Follow up Note  Name: Aaron Ferrell   Date:   06/04/2015 MRN:  [id number] DOB: 1951-07-28    This 64 y.o. male presents to the clinic today for prostate cancer.  REFERRING PROVIDER: No ref. 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 He is seen today in routine follow-up is doing fairly well although continues to complain of back pain uses 2 canes for ambulation. He is known to have a lesion of his L1 spine consistent with metastatic disease. He has not had a bone scan in over a year. He is on Lupron therapy and his PSAs have been in the low to undetectable range. He is being closely followed by urology at [facility].Marland Kitchen  COMPLICATIONS OF TREATMENT: none  FOLLOW UP COMPLIANCE: keeps appointments   PHYSICAL EXAM:  BP 129/74 mmHg  Pulse 79  Temp(Src) 98 F (36.7 C)  Wt 195 lb 5.2 oz (88.6 kg) Well-developed male in NAD. On rectal exam rectal sphincter tone is good prostatic fossa is clear without evidence of nodularity or mass. Well-developed well-nourished patient in NAD. HEENT reveals PERLA, EOMI, discs not visualized.  Oral cavity is clear. No oral mucosal lesions are identified. Neck is clear without evidence of cervical or supraclavicular adenopathy. Lungs are clear to A&P. Cardiac examination is essentially unremarkable with regular rate and rhythm without murmur rub or thrill. Abdomen is benign with no organomegaly or masses noted. Motor sensory and DTR levels are equal and symmetric in the upper and lower extremities. Cranial nerves II through XII are grossly intact. Proprioception is intact. No peripheral adenopathy or edema is identified. No motor or sensory levels are noted. Crude visual fields are within normal range.  RADIOLOGY RESULTS: I have reviewed his old bone scan have requested urology to repeat his bone scan at this  point. He also has known rib metastasis. Palliative radiation therapy to his spine or Xofigo treatments may be indicated.  PLAN: At this time I have asked urology to order a bone scan which I would like to review. I am concerned about his lumbar spine and possible progression of disease even though his PSA remains in the undetectable range. Again would offer her treatment options as outlined above should any of those areas be problematic. This was all explained to the patient and his wife. I've otherwise asked to see him back in 6 months for follow-up. He's to contact my office should his pain worsen.  I would like to take this opportunity for allowing me to participate in the care of your patient.Armstead Peaks., MD

## 2015-06-04 NOTE — Progress Notes (Signed)
Unable to complete the AUA screen, patient has constant incontinence.

## 2015-06-09 ENCOUNTER — Encounter: Payer: Self-pay | Admitting: Gastroenterology

## 2015-12-08 ENCOUNTER — Ambulatory Visit
Admission: RE | Admit: 2015-12-08 | Discharge: 2015-12-08 | Disposition: A | Discharge status: Home / Self Care | Payer: Medicare Other | Source: Ambulatory Visit | Attending: Radiation Oncology | Admitting: Radiation Oncology

## 2015-12-08 ENCOUNTER — Encounter: Payer: Self-pay | Admitting: Radiation Oncology

## 2015-12-08 DIAGNOSIS — Z510 Encounter for antineoplastic radiation therapy: Secondary | ICD-10-CM | POA: Insufficient documentation

## 2015-12-08 DIAGNOSIS — C7951 Secondary malignant neoplasm of bone: Secondary | ICD-10-CM | POA: Insufficient documentation

## 2015-12-08 DIAGNOSIS — C61 Malignant neoplasm of prostate: Secondary | ICD-10-CM | POA: Diagnosis not present

## 2015-12-08 NOTE — Progress Notes (Signed)
Radiation Oncology Follow up Note  Name: Aaron Ferrell   Date:   12/08/2015 MRN:  [id number] DOB: [birth year]                                                        (OPNA) old patient new area bone metastasis   This 65 y.o. male presents to the clinic today for follow-up for stage IV prostate cancer.  REFERRING PROVIDER: [name], MD  HPI: Patient is a 65 year old male well-known to department having been treated with salvage radiation therapy for adenocarcinoma the prostate status post radical prostatectomy then underwent biochemical failure. He's been known to have a lesion at L1 consistent with metastatic disease. He has been on androgen deprivation therapy. Last time I saw him back in October 2016 I asked urology to perform a bone scan. His PSA has remained in the undetectable range. Bone scan was performed showing L1 vertebral body lesion more conspicuous compared to prior studies otherwise osseous disease was stable. This was followed by MRI scan of his lumbar spine showing progression of pathologic compression fracture at L1 with retropulsed fragments and new epidermal tumor resulting in severe canal systemic stenosis at the L1 level. I've asked to evaluate him for palliative radiation therapy to this area. He still walks with 2 canes for ambulatory assistance.  COMPLICATIONS OF TREATMENT: none  FOLLOW UP COMPLIANCE: keeps appointments   PHYSICAL EXAM:  BP 123/65 mmHg  Temp(Src) 97.5 F (36.4 C)  Wt 203 lb 7.8 oz (92.3 kg) Well-developed male walks with 2 canes respiratory assistance. Motor and sensory levels in his lower extremities are equal and symmetric proprioception is intact. Deep palpation of his lumbar spine does not elicit pain range of motion of his lower extremities does not elicit pain. Well-developed well-nourished patient in NAD. HEENT reveals PERLA, EOMI, discs not visualized.  Oral cavity is clear. No oral mucosal lesions are identified. Neck is clear without  evidence of cervical or supraclavicular adenopathy. Lungs are clear to A&P. Cardiac examination is essentially unremarkable with regular rate and rhythm without murmur rub or thrill. Abdomen is benign with no organomegaly or masses noted. Motor sensory and DTR levels are equal and symmetric in the upper and lower extremities. Cranial nerves II through XII are grossly intact. Proprioception is intact. No peripheral adenopathy or edema is identified. No motor or sensory levels are noted. Crude visual fields are within normal range.  RADIOLOGY RESULTS: Bone scan and MRI scans reviewed  PLAN: At this time I to go ahead with a course of palliative radiation therapy to his L1 vertebral body. Would plan on delivering 3000 cGy in 10 fractions. I have set up and personally ordered CT simulation today. I've also discussed Xofigo treatment which we may decide on in the future should his lesions begin to progress. Patient wife both seem to comprehend my treatment plan well. Side effects from treatment including possible diarrhea fatigue alteration of blood counts skin reaction all were discussed with patient and wife in detail.  I would like to take this opportunity for allowing me to participate in the care of your patient.Armstead Peaks., MD

## 2015-12-09 ENCOUNTER — Ambulatory Visit: Payer: Medicare Other

## 2015-12-10 ENCOUNTER — Ambulatory Visit: Payer: Medicare Other | Admitting: Radiation Oncology

## 2015-12-10 ENCOUNTER — Inpatient Hospital Stay: Admission: RE | Admit: 2015-12-10 | Payer: Medicare Other | Source: Ambulatory Visit | Admitting: Radiation Oncology

## 2015-12-10 DIAGNOSIS — Z510 Encounter for antineoplastic radiation therapy: Secondary | ICD-10-CM | POA: Diagnosis not present

## 2015-12-14 ENCOUNTER — Ambulatory Visit
Admission: RE | Admit: 2015-12-14 | Discharge: 2015-12-14 | Disposition: A | Discharge status: Home / Self Care | Payer: Medicare Other | Source: Ambulatory Visit | Attending: Radiation Oncology | Admitting: Radiation Oncology

## 2015-12-14 DIAGNOSIS — Z510 Encounter for antineoplastic radiation therapy: Secondary | ICD-10-CM | POA: Diagnosis not present

## 2015-12-15 ENCOUNTER — Ambulatory Visit
Admission: RE | Admit: 2015-12-15 | Discharge: 2015-12-15 | Disposition: A | Discharge status: Home / Self Care | Payer: Medicare Other | Source: Ambulatory Visit | Attending: Radiation Oncology | Admitting: Radiation Oncology

## 2015-12-15 DIAGNOSIS — Z510 Encounter for antineoplastic radiation therapy: Secondary | ICD-10-CM | POA: Diagnosis not present

## 2015-12-16 ENCOUNTER — Ambulatory Visit
Admission: RE | Admit: 2015-12-16 | Discharge: 2015-12-16 | Disposition: A | Discharge status: Home / Self Care | Payer: Medicare Other | Source: Ambulatory Visit | Attending: Radiation Oncology | Admitting: Radiation Oncology

## 2015-12-16 DIAGNOSIS — Z510 Encounter for antineoplastic radiation therapy: Secondary | ICD-10-CM | POA: Diagnosis not present

## 2015-12-17 ENCOUNTER — Ambulatory Visit
Admission: RE | Admit: 2015-12-17 | Discharge: 2015-12-17 | Disposition: A | Discharge status: Home / Self Care | Payer: Medicare Other | Source: Ambulatory Visit | Attending: Radiation Oncology | Admitting: Radiation Oncology

## 2015-12-17 DIAGNOSIS — Z510 Encounter for antineoplastic radiation therapy: Secondary | ICD-10-CM | POA: Diagnosis not present

## 2015-12-20 ENCOUNTER — Ambulatory Visit
Admission: RE | Admit: 2015-12-20 | Discharge: 2015-12-20 | Disposition: A | Discharge status: Home / Self Care | Payer: Medicare Other | Source: Ambulatory Visit | Attending: Radiation Oncology | Admitting: Radiation Oncology

## 2015-12-20 DIAGNOSIS — Z510 Encounter for antineoplastic radiation therapy: Secondary | ICD-10-CM | POA: Diagnosis not present

## 2015-12-21 ENCOUNTER — Ambulatory Visit
Admission: RE | Admit: 2015-12-21 | Discharge: 2015-12-21 | Disposition: A | Discharge status: Home / Self Care | Payer: Medicare Other | Source: Ambulatory Visit | Attending: Radiation Oncology | Admitting: Radiation Oncology

## 2015-12-21 ENCOUNTER — Other Ambulatory Visit: Payer: Self-pay | Admitting: *Deleted

## 2015-12-21 DIAGNOSIS — C7951 Secondary malignant neoplasm of bone: Secondary | ICD-10-CM

## 2015-12-21 DIAGNOSIS — Z510 Encounter for antineoplastic radiation therapy: Secondary | ICD-10-CM | POA: Diagnosis not present

## 2015-12-22 ENCOUNTER — Inpatient Hospital Stay: Payer: Medicare Other | Attending: Radiation Oncology

## 2015-12-22 ENCOUNTER — Ambulatory Visit
Admission: RE | Admit: 2015-12-22 | Discharge: 2015-12-22 | Disposition: A | Discharge status: Home / Self Care | Payer: Medicare Other | Source: Ambulatory Visit | Attending: Radiation Oncology | Admitting: Radiation Oncology

## 2015-12-22 DIAGNOSIS — Z510 Encounter for antineoplastic radiation therapy: Secondary | ICD-10-CM | POA: Diagnosis not present

## 2015-12-22 DIAGNOSIS — Z9079 Acquired absence of other genital organ(s): Secondary | ICD-10-CM | POA: Diagnosis not present

## 2015-12-22 DIAGNOSIS — C7951 Secondary malignant neoplasm of bone: Secondary | ICD-10-CM

## 2015-12-22 DIAGNOSIS — C61 Malignant neoplasm of prostate: Secondary | ICD-10-CM | POA: Insufficient documentation

## 2015-12-22 DIAGNOSIS — Z923 Personal history of irradiation: Secondary | ICD-10-CM | POA: Diagnosis not present

## 2015-12-22 LAB — CBC
HCT: 34.8 % — ABNORMAL LOW (ref 40.0–52.0)
Hemoglobin: 12 g/dL — ABNORMAL LOW (ref 13.0–18.0)
MCH: 30.9 pg (ref 26.0–34.0)
MCHC: 34.4 g/dL (ref 32.0–36.0)
MCV: 89.9 fL (ref 80.0–100.0)
PLATELETS: 185 10*3/uL (ref 150–440)
RBC: 3.88 MIL/uL — AB (ref 4.40–5.90)
RDW: 13.2 % (ref 11.5–14.5)
WBC: 6.5 10*3/uL (ref 3.8–10.6)

## 2015-12-23 ENCOUNTER — Ambulatory Visit
Admission: RE | Admit: 2015-12-23 | Discharge: 2015-12-23 | Disposition: A | Discharge status: Home / Self Care | Payer: Medicare Other | Source: Ambulatory Visit | Attending: Radiation Oncology | Admitting: Radiation Oncology

## 2015-12-23 DIAGNOSIS — Z510 Encounter for antineoplastic radiation therapy: Secondary | ICD-10-CM | POA: Diagnosis not present

## 2015-12-24 ENCOUNTER — Ambulatory Visit
Admission: RE | Admit: 2015-12-24 | Discharge: 2015-12-24 | Disposition: A | Discharge status: Home / Self Care | Payer: Medicare Other | Source: Ambulatory Visit | Attending: Radiation Oncology | Admitting: Radiation Oncology

## 2015-12-24 DIAGNOSIS — Z510 Encounter for antineoplastic radiation therapy: Secondary | ICD-10-CM | POA: Diagnosis not present

## 2015-12-27 ENCOUNTER — Ambulatory Visit
Admission: RE | Admit: 2015-12-27 | Discharge: 2015-12-27 | Disposition: A | Discharge status: Home / Self Care | Payer: Medicare Other | Source: Ambulatory Visit | Attending: Radiation Oncology | Admitting: Radiation Oncology

## 2015-12-27 DIAGNOSIS — Z510 Encounter for antineoplastic radiation therapy: Secondary | ICD-10-CM | POA: Diagnosis not present

## 2015-12-28 ENCOUNTER — Ambulatory Visit: Payer: Medicare Other

## 2016-02-03 ENCOUNTER — Ambulatory Visit
Admission: RE | Admit: 2016-02-03 | Discharge: 2016-02-03 | Disposition: A | Discharge status: Home / Self Care | Payer: Medicare Other | Source: Ambulatory Visit | Attending: Radiation Oncology | Admitting: Radiation Oncology

## 2016-02-03 ENCOUNTER — Other Ambulatory Visit: Payer: Self-pay | Admitting: *Deleted

## 2016-02-03 ENCOUNTER — Encounter: Payer: Self-pay | Admitting: Radiation Oncology

## 2016-02-03 VITALS — BP 130/74 | HR 71 | Temp 96.9°F | Resp 20 | Wt 202.4 lb

## 2016-02-03 DIAGNOSIS — C7951 Secondary malignant neoplasm of bone: Principal | ICD-10-CM

## 2016-02-03 DIAGNOSIS — C61 Malignant neoplasm of prostate: Secondary | ICD-10-CM

## 2016-02-03 NOTE — Progress Notes (Signed)
Radiation Oncology Follow up Note  Name: Aaron Ferrell   Date:   02/03/2016 MRN:  [id number] DOB: [birth year]    This 65 y.o. male presents to the clinic today for follow-up for stage IV prostate cancer 1 month out from radiation therapy to his L1 vertebral body.  REFERRING PROVIDER: [name], MD  HPI: Patient is a 65 year old male with known stage IV prostate cancer. He's been on androgen deprivation therapy now out 1 month having completed palliative radiation therapy to his L1 spine. He is doing fairly well although he states his pain is more lower in his spine. Patient has been status post salvage radiation therapy for prostate cancer. He continues to use 2 canes form hematuria assistance. He is not had a bone in well over a year.  COMPLICATIONS OF TREATMENT: none  FOLLOW UP COMPLIANCE: keeps appointments   PHYSICAL EXAM:  BP 130/74 mmHg  Pulse 71  Temp(Src) 96.9 F (36.1 C)  Resp 20  Wt 202 lb 6.1 oz (91.8 kg) There some slight decreased strength in his left lower extremity. No sensory loss noted. No pain on deep palpation of his spine. He uses 2 canes for a mandatory assistance. Well-developed well-nourished patient in NAD. HEENT reveals PERLA, EOMI, discs not visualized.  Oral cavity is clear. No oral mucosal lesions are identified. Neck is clear without evidence of cervical or supraclavicular adenopathy. Lungs are clear to A&P. Cardiac examination is essentially unremarkable with regular rate and rhythm without murmur rub or thrill. Abdomen is benign with no organomegaly or masses noted. Motor sensory and DTR levels are equal and symmetric in the upper and lower extremities. Cranial nerves II through XII are grossly intact. Proprioception is intact. No peripheral adenopathy or edema is identified. No motor or sensory levels are noted. Crude visual fields are within normal range.  RADIOLOGY RESULTS: Bone scan ordered  PLAN: I've ordered a bone scan for comparison previous  examinations to see if there is aggressive disease. We did speak aboutXofigo treatment and I'm still inclined to go ahead with that should we see progressive bone metastasis on his bone scan. We'll see him back shortly after the bone scan for those results. Otherwise I am please was overall progress. I would like to take this opportunity to thank you for allowing me to participate in the care of your patient.Armstead Peaks., MD

## 2016-02-10 ENCOUNTER — Ambulatory Visit
Admission: RE | Admit: 2016-02-10 | Discharge: 2016-02-10 | Disposition: A | Discharge status: Home / Self Care | Payer: Medicare Other | Source: Ambulatory Visit | Attending: Radiation Oncology | Admitting: Radiation Oncology

## 2016-02-10 ENCOUNTER — Other Ambulatory Visit: Payer: Self-pay | Admitting: *Deleted

## 2016-02-10 DIAGNOSIS — C7951 Secondary malignant neoplasm of bone: Secondary | ICD-10-CM

## 2016-02-10 DIAGNOSIS — R938 Abnormal findings on diagnostic imaging of other specified body structures: Secondary | ICD-10-CM | POA: Insufficient documentation

## 2016-02-10 MED ORDER — TECHNETIUM TC 99M MEDRONATE IV KIT
25.0000 | PACK | Freq: Once | INTRAVENOUS | Status: AC | PRN
  Administered 2016-02-10: 23.96 via INTRAVENOUS

## 2016-02-15 ENCOUNTER — Ambulatory Visit: Payer: Medicare Other | Admitting: Radiation Oncology

## 2016-02-15 ENCOUNTER — Inpatient Hospital Stay: Admission: RE | Admit: 2016-02-15 | Payer: Medicare Other | Source: Ambulatory Visit | Admitting: Radiation Oncology

## 2016-03-01 ENCOUNTER — Other Ambulatory Visit: Payer: Self-pay | Admitting: Radiation Oncology

## 2016-03-01 ENCOUNTER — Ambulatory Visit
Admission: RE | Admit: 2016-03-01 | Discharge: 2016-03-01 | Disposition: A | Discharge status: Home / Self Care | Payer: Medicare Other | Source: Ambulatory Visit | Attending: Radiation Oncology | Admitting: Radiation Oncology

## 2016-03-01 DIAGNOSIS — M4854XA Collapsed vertebra, not elsewhere classified, thoracic region, initial encounter for fracture: Secondary | ICD-10-CM | POA: Diagnosis not present

## 2016-03-01 DIAGNOSIS — M4806 Spinal stenosis, lumbar region: Secondary | ICD-10-CM | POA: Insufficient documentation

## 2016-03-01 DIAGNOSIS — C7951 Secondary malignant neoplasm of bone: Secondary | ICD-10-CM | POA: Insufficient documentation

## 2016-03-01 DIAGNOSIS — M4856XA Collapsed vertebra, not elsewhere classified, lumbar region, initial encounter for fracture: Secondary | ICD-10-CM | POA: Diagnosis not present

## 2016-03-01 MED ORDER — GADOBENATE DIMEGLUMINE 529 MG/ML IV SOLN
20.0000 mL | Freq: Once | INTRAVENOUS | Status: AC | PRN
  Administered 2016-03-01: 19 mL via INTRAVENOUS

## 2016-03-03 ENCOUNTER — Ambulatory Visit
Admission: RE | Admit: 2016-03-03 | Discharge: 2016-03-03 | Disposition: A | Discharge status: Home / Self Care | Payer: Medicare Other | Source: Ambulatory Visit | Attending: Radiation Oncology | Admitting: Radiation Oncology

## 2016-03-03 ENCOUNTER — Encounter: Payer: Self-pay | Admitting: Radiation Oncology

## 2016-03-03 VITALS — BP 114/70 | HR 64 | Temp 96.3°F | Resp 20 | Wt 200.6 lb

## 2016-03-03 DIAGNOSIS — M4856XS Collapsed vertebra, not elsewhere classified, lumbar region, sequela of fracture: Secondary | ICD-10-CM | POA: Insufficient documentation

## 2016-03-03 DIAGNOSIS — C7951 Secondary malignant neoplasm of bone: Secondary | ICD-10-CM | POA: Insufficient documentation

## 2016-03-03 DIAGNOSIS — Z923 Personal history of irradiation: Secondary | ICD-10-CM | POA: Diagnosis not present

## 2016-03-03 DIAGNOSIS — C61 Malignant neoplasm of prostate: Secondary | ICD-10-CM | POA: Insufficient documentation

## 2016-03-03 NOTE — Progress Notes (Signed)
Radiation Oncology Follow up Note  Name: Aaron Ferrell   Date:   03/03/2016 MRN:  [id number] DOB: 21-Dec-1950    This 65 y.o. male presents to the clinic today for follow-up for stage IV prostate cancer with recent MRI scan of his spine.  REFERRING PROVIDER: [name], MD  HPI: Patient is a 65 year old male with known stage IV prostate cancer we recent treated to his L1 vertebral body. I recently ordered a bone scan. Again showing increased activity in the L1 region which prompted a gadolinium-enhanced MRI scan of that region. MRI scan demonstrated compression fracture of L1 with 5 mm of posterior bony retropulsion with images favoring a benign compression fracture the retropulsion causes bilateral foraminal stenosis at L1-L2 and central now ringing of the thecal sac posteriorly to lower L1 vertebral body. His symptoms have not worsened all he continues to have significant pain in that region.  COMPLICATIONS OF TREATMENT: none  FOLLOW UP COMPLIANCE: keeps appointments   PHYSICAL EXAM:  BP 114/70 mmHg  Pulse 64  Temp(Src) 96.3 F (35.7 C)  Resp 20  Wt 200 lb 9.9 oz (91 kg) Well-developed well-nourished patient in NAD he uses canes by for bilateral ambulatory assistance.Marland Kitchen HEENT reveals PERLA, EOMI, discs not visualized.  Oral cavity is clear. No oral mucosal lesions are identified. Neck is clear without evidence of cervical or supraclavicular adenopathy. Lungs are clear to A&P. Cardiac examination is essentially unremarkable with regular rate and rhythm without murmur rub or thrill. Abdomen is benign with no organomegaly or masses noted. Motor sensory and DTR levels are equal and symmetric in the upper and lower extremities. Cranial nerves II through XII are grossly intact. Proprioception is intact. No peripheral adenopathy or edema is identified. No motor or sensory levels are noted. Crude visual fields are within normal range.  RADIOLOGY RESULTS: MRI scan and bone scan are again  reviewed  PLAN: At this time I referring him to his back surgeon and will make a copy of his MRI scan is well as the results. At this time I see no evidence to suggest further progression of his disease and would hold off on any further palliative radiation therapy or Xofigo treatment. Patient wife both comprehend my treatment plan well. I have set up a four-month follow-up appointment with the patient and will keep abreast of all further consultations. Patient wife know to call with any concerns.  I would like to take this opportunity to thank you for allowing me to participate in the care of your patient.Armstead Peaks., MD

## 2016-08-18 ENCOUNTER — Ambulatory Visit
Admission: RE | Admit: 2016-08-18 | Discharge: 2016-08-18 | Disposition: A | Discharge status: Home / Self Care | Payer: Medicare Other | Source: Ambulatory Visit | Attending: Radiation Oncology | Admitting: Radiation Oncology

## 2016-08-18 ENCOUNTER — Encounter: Payer: Self-pay | Admitting: Radiation Oncology

## 2016-08-18 VITALS — BP 116/67 | HR 69 | Temp 96.3°F | Resp 20 | Wt 207.1 lb

## 2016-08-18 DIAGNOSIS — C7951 Secondary malignant neoplasm of bone: Secondary | ICD-10-CM | POA: Insufficient documentation

## 2016-08-18 DIAGNOSIS — C61 Malignant neoplasm of prostate: Secondary | ICD-10-CM | POA: Diagnosis not present

## 2016-08-18 NOTE — Progress Notes (Signed)
Radiation Oncology Follow up Note  Name: Aaron Ferrell   Date:   08/18/2016 MRN:  [id number] DOB: [birth year]    This 65 y.o. male presents to the clinic today for follow-up for stage IV prostate cancer with consideration forXofigo .  REFERRING PROVIDER: [name], MD  HPI: Patient is a 65 year old male well known to our department.Abdomen treated to his L1 vertebral body for metastatic stage IV prostate cancer. We have been following him closely and he recently was seen at [facility] were bone scan showed numerous areas of increased uptake in the skull clavicles scalp spine ribs pelvis left proximal humerus and right proximal femur all likely representing metastatic disease. Uptake in the L1 vertebral body was decreased from prior study. CT scan was also performed showing small sclerotic foci of metastatic disease throughout the axial skeleton consistent with a bone scan. Patient also has adenopathy most likely secondary to metastatic disease in the pelvic region measuring approximate 1.4 cm. They're considering systemic therapy also referred back to our clinic for possible radium 225 treatments. 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  COMPLICATIONS OF TREATMENT: none  FOLLOW UP COMPLIANCE: keeps appointments   PHYSICAL EXAM:  BP 116/67   Pulse 69   Temp (!) 96.3 F (35.7 C)   Resp 20   Wt 207 lb 2 oz (93.9 kg)   BMI 32.44 kg/m  Well-developed male in moderate pain discomfort. Range of motion of his lower extremities elicits significant pain right greater than left no focal motor or sensory levels are appreciated. Deep palpation of the spine does not elicit pain. Well-developed well-nourished patient in NAD. HEENT reveals PERLA, EOMI, discs not visualized.  Oral cavity is clear. No oral mucosal lesions are identified. Neck is clear without evidence of cervical or supraclavicular  adenopathy. Lungs are clear to A&P. Cardiac examination is essentially unremarkable with regular rate and rhythm without murmur rub or thrill. Abdomen is benign with no organomegaly or masses noted. Motor sensory and DTR levels are equal and symmetric in the upper and lower extremities. Cranial nerves II through XII are grossly intact. Proprioception is intact. No peripheral adenopathy or edema is identified. No motor or sensory levels are noted. Crude visual fields are within normal range. RADIOLOGY RESULTS: Bone scan and CT scans from [facility] have been requested for my review   PLAN: At this time believe the patient be an excellent candidate for Xofigo radium 225. Risks and benefits of treatment including allergic reaction possible bone marrow compromise were discussed in detail with the patient and his wife. Printed information were given about the drug for the patient and family to review. We will move forward with planning his first infusion. Patient also will be closely followed at [facility] for possibility of systemic therapy which can be easily added on after macro X treatment.  I would like to take this opportunity to thank you for allowing me to participate in the care of your patient.Armstead Peaks., MD

## 2016-08-24 ENCOUNTER — Other Ambulatory Visit: Payer: Self-pay | Admitting: *Deleted

## 2016-08-24 DIAGNOSIS — C7951 Secondary malignant neoplasm of bone: Principal | ICD-10-CM

## 2016-08-24 DIAGNOSIS — C61 Malignant neoplasm of prostate: Secondary | ICD-10-CM | POA: Insufficient documentation

## 2016-08-31 ENCOUNTER — Other Ambulatory Visit: Payer: Self-pay | Admitting: Radiation Oncology

## 2016-09-01 ENCOUNTER — Other Ambulatory Visit: Payer: Self-pay | Admitting: Radiation Oncology

## 2016-09-01 ENCOUNTER — Encounter: Payer: Self-pay | Admitting: Radiology

## 2016-09-01 DIAGNOSIS — C61 Malignant neoplasm of prostate: Secondary | ICD-10-CM

## 2016-09-01 DIAGNOSIS — C7951 Secondary malignant neoplasm of bone: Principal | ICD-10-CM

## 2016-09-04 ENCOUNTER — Ambulatory Visit: Payer: Medicare Other | Admitting: Radiation Oncology

## 2016-09-07 ENCOUNTER — Inpatient Hospital Stay: Payer: Medicare Other | Attending: Radiation Oncology

## 2016-09-07 DIAGNOSIS — C61 Malignant neoplasm of prostate: Secondary | ICD-10-CM | POA: Insufficient documentation

## 2016-09-07 DIAGNOSIS — C7951 Secondary malignant neoplasm of bone: Secondary | ICD-10-CM | POA: Insufficient documentation

## 2016-09-08 ENCOUNTER — Encounter: Payer: Self-pay | Admitting: *Deleted

## 2016-09-08 ENCOUNTER — Inpatient Hospital Stay: Payer: Medicare Other

## 2016-09-08 DIAGNOSIS — C7951 Secondary malignant neoplasm of bone: Secondary | ICD-10-CM | POA: Diagnosis not present

## 2016-09-08 DIAGNOSIS — C61 Malignant neoplasm of prostate: Secondary | ICD-10-CM | POA: Diagnosis not present

## 2016-09-08 LAB — CBC WITH DIFFERENTIAL/PLATELET
Basophils Absolute: 0 10*3/uL (ref 0–0.1)
Basophils Relative: 0 %
EOS ABS: 0.3 10*3/uL (ref 0–0.7)
Eosinophils Relative: 6 %
HCT: 34.3 % — ABNORMAL LOW (ref 40.0–52.0)
Hemoglobin: 12 g/dL — ABNORMAL LOW (ref 13.0–18.0)
LYMPHS ABS: 0.8 10*3/uL — AB (ref 1.0–3.6)
LYMPHS PCT: 19 %
MCH: 31.5 pg (ref 26.0–34.0)
MCHC: 34.9 g/dL (ref 32.0–36.0)
MCV: 90.4 fL (ref 80.0–100.0)
MONO ABS: 0.4 10*3/uL (ref 0.2–1.0)
Monocytes Relative: 9 %
Neutro Abs: 2.8 10*3/uL (ref 1.4–6.5)
Neutrophils Relative %: 66 %
PLATELETS: 196 10*3/uL (ref 150–440)
RBC: 3.79 MIL/uL — ABNORMAL LOW (ref 4.40–5.90)
RDW: 12.7 % (ref 11.5–14.5)
WBC: 4.3 10*3/uL (ref 3.8–10.6)

## 2016-09-08 NOTE — Progress Notes (Signed)
Patient here today for a lab and weight prior to receiving  xofigo injection.  His weight is 90.80kg

## 2016-09-13 ENCOUNTER — Encounter
Admission: RE | Admit: 2016-09-13 | Discharge: 2016-09-13 | Disposition: A | Discharge status: Home / Self Care | Payer: Medicare Other | Source: Ambulatory Visit | Attending: Radiation Oncology | Admitting: Radiation Oncology

## 2016-09-13 DIAGNOSIS — C61 Malignant neoplasm of prostate: Secondary | ICD-10-CM | POA: Insufficient documentation

## 2016-09-13 DIAGNOSIS — C7951 Secondary malignant neoplasm of bone: Secondary | ICD-10-CM | POA: Insufficient documentation

## 2016-09-13 NOTE — Progress Notes (Signed)
Radiation Oncology Follow up Note  Name: Aaron Ferrell   Date:   09/13/2016 MRN:  [id number] DOB: 10-04-1950    This 66 y.o. male presents to the clinic today for firstXofigo injection.  REFERRING PROVIDER: Noreene Filbert, MD  HPI: Patient is a 66 year old male with stage IV adenocarcinoma the prostate treated with external beam radiation therapy to his L1 vertebral body. [facility] bone scan recent showed marked progression of disease confirmed on CT scan. He was seen in consultation and is seen today for his first radium 225 treatment. He is doing well without complaint today pain is stable..  COMPLICATIONS OF TREATMENT: none  FOLLOW UP COMPLIANCE: keeps appointments   PHYSICAL EXAM:  There were no vitals taken for this visit. Well-developed well-nourished patient in NAD. HEENT reveals PERLA, EOMI, discs not visualized.  Oral cavity is clear. No oral mucosal lesions are identified. Neck is clear without evidence of cervical or supraclavicular adenopathy. Lungs are clear to A&P. Cardiac examination is essentially unremarkable with regular rate and rhythm without murmur rub or thrill. Abdomen is benign with no organomegaly or masses noted. Motor sensory and DTR levels are equal and symmetric in the upper and lower extremities. Cranial nerves II through XII are grossly intact. Proprioception is intact. No peripheral adenopathy or edema is identified. No motor or sensory levels are noted. Crude visual fields are within normal range.  RADIOLOGY RESULTS: No films for review  PLAN: Peripheral line was started on the patient and 20 cc of normal saline were passed to make sure there was patency of the lines. Trudi Ida was administered over a 5 minute infusion push by nuclear medicine technologist supervised by radiation oncologist. After completion of IV push of Xofigo 30 cc an additional saline were passed through the peripheral line. Oral lines syringes drapes and original container of Xofigo were then  taken to nuclear medicine for storage. Patient tolerated the procedure well without side effect or complaint. Patient has anti-emetic medication. Have scheduled the patient for a three-week followup to check on his counts. Patient is to call sooner with any side effects or complaints.     Armstead Peaks., MD

## 2016-09-14 MED ORDER — RADIUM RA 223 DICHLORIDE 30 MCCI/ML IV SOLN: 135.3000 | Freq: Once | INTRAVENOUS | 0 refills | Status: AC

## 2016-10-05 ENCOUNTER — Inpatient Hospital Stay: Payer: Medicare Other | Attending: Radiation Oncology

## 2016-10-05 DIAGNOSIS — C7951 Secondary malignant neoplasm of bone: Secondary | ICD-10-CM | POA: Insufficient documentation

## 2016-10-05 DIAGNOSIS — C61 Malignant neoplasm of prostate: Secondary | ICD-10-CM | POA: Diagnosis present

## 2016-10-05 LAB — CBC WITH DIFFERENTIAL/PLATELET
BASOS PCT: 1 %
Basophils Absolute: 0 10*3/uL (ref 0–0.1)
EOS ABS: 0.3 10*3/uL (ref 0–0.7)
Eosinophils Relative: 9 %
HCT: 30 % — ABNORMAL LOW (ref 40.0–52.0)
Hemoglobin: 10.7 g/dL — ABNORMAL LOW (ref 13.0–18.0)
Lymphocytes Relative: 30 %
Lymphs Abs: 0.8 10*3/uL — ABNORMAL LOW (ref 1.0–3.6)
MCH: 31.8 pg (ref 26.0–34.0)
MCHC: 35.7 g/dL (ref 32.0–36.0)
MCV: 89.3 fL (ref 80.0–100.0)
MONO ABS: 0.2 10*3/uL (ref 0.2–1.0)
MONOS PCT: 8 %
Neutro Abs: 1.4 10*3/uL (ref 1.4–6.5)
Neutrophils Relative %: 52 %
Platelets: 184 10*3/uL (ref 150–440)
RBC: 3.36 MIL/uL — ABNORMAL LOW (ref 4.40–5.90)
RDW: 13.5 % (ref 11.5–14.5)
WBC: 2.8 10*3/uL — ABNORMAL LOW (ref 3.8–10.6)

## 2016-10-05 NOTE — Progress Notes (Signed)
Pts weight today is 87.5 kg.

## 2016-10-11 ENCOUNTER — Encounter
Admission: RE | Admit: 2016-10-11 | Discharge: 2016-10-11 | Disposition: A | Discharge status: Home / Self Care | Payer: Medicare Other | Source: Ambulatory Visit | Attending: Radiation Oncology | Admitting: Radiation Oncology

## 2016-10-11 DIAGNOSIS — C61 Malignant neoplasm of prostate: Secondary | ICD-10-CM | POA: Diagnosis present

## 2016-10-11 DIAGNOSIS — C7951 Secondary malignant neoplasm of bone: Secondary | ICD-10-CM | POA: Diagnosis present

## 2016-10-11 HISTORY — DX: Malignant (primary) neoplasm, unspecified: C80.1

## 2016-10-11 MED ORDER — RADIUM RA 223 DICHLORIDE 30 MCCI/ML IV SOLN
130.4000 | Freq: Once | INTRAVENOUS | Status: AC
  Administered 2016-10-11: 132.75 via INTRAVENOUS

## 2016-10-11 NOTE — Progress Notes (Signed)
Radiation Oncology    Second Xofigo infusion Follow up Note  Name: Aaron Ferrell   Date:   11/08/2016 MRN:  [id number] DOB: Jan 04, 1951    This 66 y.o. male presents to the clinic today for second Xofigo infusion.  REFERRING PROVIDER: Noreene Filbert, MD  HPI: Patient is a 66 year old male with stage IV adenocarcinoma the prostate presenting treated to his lumbar spine with external beam treatment he's had progression of disease and is seen today for his second radium 225 treatment. He is doing well has not noticed significant difference in his pain at this time. He specifically denies any significant side effects from his first injection..  COMPLICATIONS OF TREATMENT: none  FOLLOW UP COMPLIANCE: keeps appointments   PHYSICAL EXAM:  There were no vitals taken for this visit. Well-developed well-nourished patient in NAD. HEENT reveals PERLA, EOMI, discs not visualized.  Oral cavity is clear. No oral mucosal lesions are identified. Neck is clear without evidence of cervical or supraclavicular adenopathy. Lungs are clear to A&P. Cardiac examination is essentially unremarkable with regular rate and rhythm without murmur rub or thrill. Abdomen is benign with no organomegaly or masses noted. Motor sensory and DTR levels are equal and symmetric in the upper and lower extremities. Cranial nerves II through XII are grossly intact. Proprioception is intact. No peripheral adenopathy or edema is identified. No motor or sensory levels are noted. Crude visual fields are within normal range.  RADIOLOGY RESULTS: No current films for review  PLAN:Peripheral line was started on the patient and 20 cc of normal saline were passed to make sure there was patency of the lines. Trudi Ida was administered over a 5 minute infusion push by nuclear medicine technologist supervised by radiation oncologist. After completion of IV push of Xofigo 30 cc an additional saline were passed through the peripheral line. Oral lines  syringes drapes and original container of Xofigo were then taken to nuclear medicine for storage. Patient tolerated the procedure well without side effect or complaint. Patient has anti-emetic medication. Have scheduled the patient for a three-week followup to check on his counts. Patient is to call sooner with any side effects or complaints.    Armstead Peaks., MD

## 2016-10-11 NOTE — Progress Notes (Signed)
Radiation Oncology    Second Xofigo infusion Follow up Note  Name: Aaron Ferrell   Date:   11/08/2016 MRN:  [id number] DOB: 1950-09-08    This 66 y.o. male presents to the clinic today for second Xofigo infusion.  REFERRING PROVIDER: Noreene Filbert, MD  HPI: Patient is a 66 year old male with stage IV adenocarcinoma the prostate presenting treated to his lumbar spine with external beam treatment he's had progression of disease and is seen today for his second radium 225 treatment. He is doing well has not noticed significant difference in his pain at this time. He specifically denies any significant side effects from his first injection..  COMPLICATIONS OF TREATMENT: none  FOLLOW UP COMPLIANCE: keeps appointments   PHYSICAL EXAM:  There were no vitals taken for this visit. Well-developed well-nourished patient in NAD. HEENT reveals PERLA, EOMI, discs not visualized.  Oral cavity is clear. No oral mucosal lesions are identified. Neck is clear without evidence of cervical or supraclavicular adenopathy. Lungs are clear to A&P. Cardiac examination is essentially unremarkable with regular rate and rhythm without murmur rub or thrill. Abdomen is benign with no organomegaly or masses noted. Motor sensory and DTR levels are equal and symmetric in the upper and lower extremities. Cranial nerves II through XII are grossly intact. Proprioception is intact. No peripheral adenopathy or edema is identified. No motor or sensory levels are noted. Crude visual fields are within normal range.  RADIOLOGY RESULTS: No current films for review  PLAN:Peripheral line was started on the patient and 20 cc of normal saline were passed to make sure there was patency of the lines. Trudi Ida was administered over a 5 minute infusion push by nuclear medicine technologist supervised by radiation oncologist. After completion of IV push of Xofigo 30 cc an additional saline were passed through the peripheral line. Oral lines  syringes drapes and original container of Xofigo were then taken to nuclear medicine for storage. Patient tolerated the procedure well without side effect or complaint. Patient has anti-emetic medication. Have scheduled the patient for a three-week followup to check on his counts. Patient is to call sooner with any side effects or complaints.    Armstead Peaks., MD

## 2016-10-11 NOTE — Progress Notes (Signed)
Radiation Oncology    Second Xofigo infusion Follow up Note  Name: Aaron Ferrell   Date:   11/08/2016 MRN:  [id number] DOB: 03/18/1951    This 66 y.o. male presents to the clinic today for second Xofigo infusion.  REFERRING PROVIDER: Noreene Filbert, MD  HPI: Patient is a 66 year old male with stage IV adenocarcinoma the prostate presenting treated to his lumbar spine with external beam treatment he's had progression of disease and is seen today for his second radium 225 treatment. He is doing well has not noticed significant difference in his pain at this time. He specifically denies any significant side effects from his first injection..  COMPLICATIONS OF TREATMENT: none  FOLLOW UP COMPLIANCE: keeps appointments   PHYSICAL EXAM:  There were no vitals taken for this visit. Well-developed well-nourished patient in NAD. HEENT reveals PERLA, EOMI, discs not visualized.  Oral cavity is clear. No oral mucosal lesions are identified. Neck is clear without evidence of cervical or supraclavicular adenopathy. Lungs are clear to A&P. Cardiac examination is essentially unremarkable with regular rate and rhythm without murmur rub or thrill. Abdomen is benign with no organomegaly or masses noted. Motor sensory and DTR levels are equal and symmetric in the upper and lower extremities. Cranial nerves II through XII are grossly intact. Proprioception is intact. No peripheral adenopathy or edema is identified. No motor or sensory levels are noted. Crude visual fields are within normal range.  RADIOLOGY RESULTS: No current films for review  PLAN:Peripheral line was started on the patient and 20 cc of normal saline were passed to make sure there was patency of the lines. Trudi Ida was administered over a 5 minute infusion push by nuclear medicine technologist supervised by radiation oncologist. After completion of IV push of Xofigo 30 cc an additional saline were passed through the peripheral line. Oral lines  syringes drapes and original container of Xofigo were then taken to nuclear medicine for storage. Patient tolerated the procedure well without side effect or complaint. Patient has anti-emetic medication. Have scheduled the patient for a three-week followup to check on his counts. Patient is to call sooner with any side effects or complaints.    Armstead Peaks., MD

## 2016-11-02 ENCOUNTER — Encounter: Payer: Self-pay | Admitting: *Deleted

## 2016-11-02 ENCOUNTER — Other Ambulatory Visit: Payer: Self-pay | Admitting: *Deleted

## 2016-11-02 ENCOUNTER — Inpatient Hospital Stay: Payer: Medicare Other | Attending: Radiation Oncology

## 2016-11-02 DIAGNOSIS — C61 Malignant neoplasm of prostate: Secondary | ICD-10-CM

## 2016-11-02 DIAGNOSIS — C7951 Secondary malignant neoplasm of bone: Secondary | ICD-10-CM

## 2016-11-02 LAB — CBC WITH DIFFERENTIAL/PLATELET
BASOS ABS: 0 10*3/uL (ref 0–0.1)
BASOS PCT: 0 %
EOS PCT: 4 %
Eosinophils Absolute: 0.2 10*3/uL (ref 0–0.7)
HEMATOCRIT: 31.1 % — AB (ref 40.0–52.0)
Hemoglobin: 11 g/dL — ABNORMAL LOW (ref 13.0–18.0)
LYMPHS PCT: 15 %
Lymphs Abs: 0.7 10*3/uL — ABNORMAL LOW (ref 1.0–3.6)
MCH: 31.6 pg (ref 26.0–34.0)
MCHC: 35.3 g/dL (ref 32.0–36.0)
MCV: 89.6 fL (ref 80.0–100.0)
MONO ABS: 0.4 10*3/uL (ref 0.2–1.0)
Monocytes Relative: 9 %
NEUTROS ABS: 3.4 10*3/uL (ref 1.4–6.5)
Neutrophils Relative %: 72 %
PLATELETS: 240 10*3/uL (ref 150–440)
RBC: 3.47 MIL/uL — AB (ref 4.40–5.90)
RDW: 13.2 % (ref 11.5–14.5)
WBC: 4.7 10*3/uL (ref 3.8–10.6)

## 2016-11-02 MED ORDER — SUCRALFATE 1 G PO TABS: 1.0000 g | ORAL_TABLET | Freq: Three times a day (TID) | ORAL | 3 refills | Status: AC

## 2016-11-02 NOTE — Progress Notes (Signed)
Labs and weight completed today. Weight 88.95kg.

## 2016-11-08 ENCOUNTER — Encounter
Admission: RE | Admit: 2016-11-08 | Discharge: 2016-11-08 | Disposition: A | Discharge status: Home / Self Care | Payer: Medicare Other | Source: Ambulatory Visit | Attending: Radiation Oncology | Admitting: Radiation Oncology

## 2016-11-08 ENCOUNTER — Ambulatory Visit: Payer: Medicare Other | Admitting: Radiation Oncology

## 2016-11-08 DIAGNOSIS — C61 Malignant neoplasm of prostate: Secondary | ICD-10-CM | POA: Insufficient documentation

## 2016-11-08 DIAGNOSIS — C7951 Secondary malignant neoplasm of bone: Secondary | ICD-10-CM | POA: Insufficient documentation

## 2016-11-08 MED ORDER — RADIUM RA 223 DICHLORIDE 30 MCCI/ML IV SOLN
134.0000 | Freq: Once | INTRAVENOUS | Status: AC
  Administered 2016-11-08: 134 via INTRAVENOUS

## 2016-11-08 NOTE — Progress Notes (Signed)
Radiation Oncology Follow up Note  Name: Aaron Ferrell   Date:   11/08/2016 MRN:  [id number] DOB: 08-May-1951    This 66 y.o. male presents to the clinic today for third Xofigo infusion.  REFERRING PROVIDER: Noreene Filbert, MD  HPI: Patient is a 66 year old male with stage IV adenocarcinoma the prostate seen today for his third macro X infusion. He is tolerated previous effusions without side effect or complaint he is noticed excellent response with significant diminution in pain. He specifically denies cough nausea diarrhea or any other GI/GU complaints..  COMPLICATIONS OF TREATMENT: none  FOLLOW UP COMPLIANCE: keeps appointments   PHYSICAL EXAM:  There were no vitals taken for this visit. Well-developed well-nourished patient in NAD. HEENT reveals PERLA, EOMI, discs not visualized.  Oral cavity is clear. No oral mucosal lesions are identified. Neck is clear without evidence of cervical or supraclavicular adenopathy. Lungs are clear to A&P. Cardiac examination is essentially unremarkable with regular rate and rhythm without murmur rub or thrill. Abdomen is benign with no organomegaly or masses noted. Motor sensory and DTR levels are equal and symmetric in the upper and lower extremities. Cranial nerves II through XII are grossly intact. Proprioception is intact. No peripheral adenopathy or edema is identified. No motor or sensory levels are noted. Crude visual fields are within normal range.  RADIOLOGY RESULTS: No current films for review  PLAN: Peripheral line was started on the patient and 20 cc of normal saline were passed to make sure there was patency of the lines. Trudi Ida was administered over a 5 minute infusion push by nuclear medicine technologist supervised by radiation oncologist. After completion of IV push of Xofigo 30 cc an additional saline were passed through the peripheral line. Oral lines syringes drapes and original container of Xofigo were then taken to nuclear medicine  for storage. Patient tolerated the procedure well without side effect or complaint. Patient has anti-emetic medication. Have scheduled the patient for a three-week followup to check on his counts. Patient is to call sooner with any side effects or complaints.    Armstead Peaks., MD

## 2016-11-30 ENCOUNTER — Inpatient Hospital Stay: Payer: Medicare Other | Attending: Radiation Oncology

## 2016-11-30 ENCOUNTER — Encounter: Payer: Self-pay | Admitting: *Deleted

## 2016-11-30 DIAGNOSIS — C61 Malignant neoplasm of prostate: Secondary | ICD-10-CM | POA: Diagnosis not present

## 2016-11-30 DIAGNOSIS — C7951 Secondary malignant neoplasm of bone: Secondary | ICD-10-CM

## 2016-11-30 LAB — CBC WITH DIFFERENTIAL/PLATELET
BASOS PCT: 1 %
Basophils Absolute: 0 10*3/uL (ref 0–0.1)
EOS ABS: 0.3 10*3/uL (ref 0–0.7)
EOS PCT: 10 %
HCT: 27.1 % — ABNORMAL LOW (ref 40.0–52.0)
Hemoglobin: 9.6 g/dL — ABNORMAL LOW (ref 13.0–18.0)
Lymphocytes Relative: 21 %
Lymphs Abs: 0.6 10*3/uL — ABNORMAL LOW (ref 1.0–3.6)
MCH: 32 pg (ref 26.0–34.0)
MCHC: 35.3 g/dL (ref 32.0–36.0)
MCV: 90.7 fL (ref 80.0–100.0)
MONOS PCT: 8 %
Monocytes Absolute: 0.2 10*3/uL (ref 0.2–1.0)
Neutro Abs: 1.6 10*3/uL (ref 1.4–6.5)
Neutrophils Relative %: 60 %
PLATELETS: 173 10*3/uL (ref 150–440)
RBC: 2.99 MIL/uL — ABNORMAL LOW (ref 4.40–5.90)
RDW: 13.8 % (ref 11.5–14.5)
WBC: 2.8 10*3/uL — ABNORMAL LOW (ref 3.8–10.6)

## 2016-11-30 NOTE — Progress Notes (Signed)
Patient here today for labs and weight his Xofigo injection on December 06, 2016.  Today's weight 90.75kg.

## 2016-12-06 ENCOUNTER — Encounter
Admission: RE | Admit: 2016-12-06 | Discharge: 2016-12-06 | Disposition: A | Discharge status: Home / Self Care | Payer: Medicare Other | Source: Ambulatory Visit | Attending: Radiation Oncology | Admitting: Radiation Oncology

## 2016-12-06 ENCOUNTER — Ambulatory Visit: Payer: Medicare Other | Admitting: Radiation Oncology

## 2016-12-06 DIAGNOSIS — C7951 Secondary malignant neoplasm of bone: Secondary | ICD-10-CM | POA: Insufficient documentation

## 2016-12-06 DIAGNOSIS — C61 Malignant neoplasm of prostate: Secondary | ICD-10-CM | POA: Diagnosis not present

## 2016-12-06 MED ORDER — RADIUM RA 223 DICHLORIDE 30 MCCI/ML IV SOLN
143.0000 | Freq: Once | INTRAVENOUS | Status: AC
  Administered 2016-12-06: 143 via INTRAVENOUS

## 2016-12-06 NOTE — Progress Notes (Signed)
Radiation Oncology Follow up Note  Name: Aaron Ferrell   Date:   12/06/2016 MRN:  [id number] DOB: [birth year]    This 66 y.o. male presents to the clinic today for fourthXofigo infusion.  REFERRING PROVIDER: Noreene Filbert, MD  HPI: Patient is a 66 year old male with known stage IV adenocarcinoma the prostate seen today for his fourthXofigo infusion. He is doing fairly well he states he is having some increased pain in his thoracic spine and possibly lower back. He is otherwise tolerated his treatments extremely well. I have reviewed his previous bone scan showing no evidence of disease in the thoracic region. He specifically denies diarrhea dysuria or any other side effects at this time..  COMPLICATIONS OF TREATMENT: none  FOLLOW UP COMPLIANCE: keeps appointments   PHYSICAL EXAM:  There were no vitals taken for this visit. Well-developed wheelchair-bound male in NAD. Well-developed well-nourished patient in NAD. HEENT reveals PERLA, EOMI, discs not visualized.  Oral cavity is clear. No oral mucosal lesions are identified. Neck is clear without evidence of cervical or supraclavicular adenopathy. Lungs are clear to A&P. Cardiac examination is essentially unremarkable with regular rate and rhythm without murmur rub or thrill. Abdomen is benign with no organomegaly or masses noted. Motor sensory and DTR levels are equal and symmetric in the upper and lower extremities. Cranial nerves II through XII are grossly intact. Proprioception is intact. No peripheral adenopathy or edema is identified. No motor or sensory levels are noted. Crude visual fields are within normal range.  RADIOLOGY RESULTS: No current films for review  PLAN: Peripheral line was started on the patient and 20 cc of normal saline were passed to make sure there was patency of the lines. Trudi Ida was administered over a 5 minute infusion push by nuclear medicine technologist supervised by radiation oncologist. After completion of  IV push of Xofigo 30 cc an additional saline were passed through the peripheral line. Oral lines syringes drapes and original container of Xofigo were then taken to nuclear medicine for storage. Patient tolerated the procedure well without side effect or complaint. Patient has anti-emetic medication. Have scheduled the patient for a three-week followup to check on his counts. Patient is to call sooner with any side effects or complaints.    Armstead Peaks., MD

## 2016-12-27 ENCOUNTER — Ambulatory Visit
Admission: RE | Admit: 2016-12-27 | Discharge: 2016-12-27 | Disposition: A | Discharge status: Home / Self Care | Payer: Medicare Other | Source: Ambulatory Visit | Attending: Radiation Oncology | Admitting: Radiation Oncology

## 2016-12-27 DIAGNOSIS — C61 Malignant neoplasm of prostate: Secondary | ICD-10-CM | POA: Insufficient documentation

## 2016-12-27 DIAGNOSIS — Z510 Encounter for antineoplastic radiation therapy: Secondary | ICD-10-CM | POA: Insufficient documentation

## 2016-12-27 DIAGNOSIS — C7951 Secondary malignant neoplasm of bone: Secondary | ICD-10-CM | POA: Insufficient documentation

## 2016-12-28 ENCOUNTER — Inpatient Hospital Stay: Payer: Medicare Other

## 2016-12-28 NOTE — Progress Notes (Signed)
Radiation Oncology Follow up Note old patient new area  Name: Aaron Ferrell   Date:   12/27/2016 MRN:  [id number] DOB: 27-Apr-1951    This 66 y.o. male presents to the clinic today for new thoracic metastatic disease from patient with known stage IV adenocarcinoma the prostate currently undergoing Xofigo.  REFERRING PROVIDER: [name], MD  HPI: Patient is a 66 year old male well known to our department who has been treated with palliative radiation therapy in the past and is currently undergoing Xofigo treatment for bony metastasis. He has been having increasing mid back pain unfortunately underwent recent bone scan showing worsening of metastatic disease with increased uptake seen in the skull mid shaft and distal metaphysis of the right humerus proximal left humerus scapular rib pelvis. He has a lesion in the thoracic spine and I have requested those films for my review. His medical oncologist at [facility] has asked [place] to discontinue the Trudi Ida even though this is a palliative treatment and not a systemic treatment for his bone metastasis. I been asked to evaluate him for possible palliative radiation therapy to his thoracic spine.   COMPLICATIONS OF TREATMENT: none  FOLLOW UP COMPLIANCE: keeps appointments   PHYSICAL EXAM:  There were no vitals taken for this visit. Well-developed wheelchair-bound male in NAD. There is point tenderness in the thoracic spine in his mid back car spun into approximately the T8-T9 region. Motor sensory and DTR levels are equal and symmetric in the lower extremities. Well-developed well-nourished patient in NAD. HEENT reveals PERLA, EOMI, discs not visualized.  Oral cavity is clear. No oral mucosal lesions are identified. Neck is clear without evidence of cervical or supraclavicular adenopathy. Lungs are clear to A&P. Cardiac examination is essentially unremarkable with regular rate and rhythm without murmur rub or thrill. Abdomen is benign with no organomegaly or  masses noted. Motor sensory and DTR levels are equal and symmetric in the upper and lower extremities. Cranial nerves II through XII are grossly intact. Proprioception is intact. No peripheral adenopathy or edema is identified. No motor or sensory levels are noted. Crude visual fields are within normal range.   RADIOLOGY RESULTS: At the present time I attempting to obtain his bone scan from [facility].   PLAN: This time I to go ahead with palliative course of radiation therapy to his thoracic spine. Would plan on delivering 3000 cGy in 10 fractions. I'm going to discontinue the final 2 fractions ofXofigo. Risks and benefits of thoracic radiation were reviewed with the patient and his wife. Possible radiation esophagitis diarrhea fatigue skin reaction alteration of blood counts all were discussed in detail with the patient. I have set him up for CT simulation next week and will review his bone scan prior to treatment planning.  I would like to take this opportunity to thank you for allowing me to participate in the care of your patient.Armstead Peaks., MD

## 2017-01-02 ENCOUNTER — Other Ambulatory Visit: Payer: Self-pay | Admitting: Radiation Oncology

## 2017-01-02 ENCOUNTER — Ambulatory Visit
Admission: RE | Admit: 2017-01-02 | Discharge: 2017-01-02 | Disposition: A | Discharge status: Home / Self Care | Payer: Self-pay | Source: Ambulatory Visit | Attending: Radiation Oncology | Admitting: Radiation Oncology

## 2017-01-02 ENCOUNTER — Ambulatory Visit
Admission: RE | Admit: 2017-01-02 | Discharge: 2017-01-02 | Disposition: A | Discharge status: Home / Self Care | Payer: Medicare Other | Source: Ambulatory Visit | Attending: Radiation Oncology | Admitting: Radiation Oncology

## 2017-01-02 DIAGNOSIS — C7951 Secondary malignant neoplasm of bone: Principal | ICD-10-CM

## 2017-01-02 DIAGNOSIS — C61 Malignant neoplasm of prostate: Secondary | ICD-10-CM

## 2017-01-02 DIAGNOSIS — Z510 Encounter for antineoplastic radiation therapy: Secondary | ICD-10-CM | POA: Diagnosis present

## 2017-01-03 ENCOUNTER — Ambulatory Visit: Payer: Medicare Other | Admitting: Radiation Oncology

## 2017-01-03 DIAGNOSIS — Z510 Encounter for antineoplastic radiation therapy: Secondary | ICD-10-CM | POA: Diagnosis not present

## 2017-01-04 ENCOUNTER — Other Ambulatory Visit: Payer: Self-pay | Admitting: *Deleted

## 2017-01-04 DIAGNOSIS — C61 Malignant neoplasm of prostate: Secondary | ICD-10-CM

## 2017-01-04 DIAGNOSIS — C7951 Secondary malignant neoplasm of bone: Principal | ICD-10-CM

## 2017-01-09 ENCOUNTER — Ambulatory Visit
Admission: RE | Admit: 2017-01-09 | Discharge: 2017-01-09 | Disposition: A | Discharge status: Home / Self Care | Payer: Medicare Other | Source: Ambulatory Visit | Attending: Radiation Oncology | Admitting: Radiation Oncology

## 2017-01-09 DIAGNOSIS — Z510 Encounter for antineoplastic radiation therapy: Secondary | ICD-10-CM | POA: Diagnosis not present

## 2017-01-10 ENCOUNTER — Ambulatory Visit
Admission: RE | Admit: 2017-01-10 | Discharge: 2017-01-10 | Disposition: A | Discharge status: Home / Self Care | Payer: Medicare Other | Source: Ambulatory Visit | Attending: Radiation Oncology | Admitting: Radiation Oncology

## 2017-01-10 DIAGNOSIS — Z510 Encounter for antineoplastic radiation therapy: Secondary | ICD-10-CM | POA: Diagnosis not present

## 2017-01-11 ENCOUNTER — Ambulatory Visit
Admission: RE | Admit: 2017-01-11 | Discharge: 2017-01-11 | Disposition: A | Discharge status: Home / Self Care | Payer: Medicare Other | Source: Ambulatory Visit | Attending: Radiation Oncology | Admitting: Radiation Oncology

## 2017-01-11 ENCOUNTER — Other Ambulatory Visit: Payer: Self-pay | Admitting: *Deleted

## 2017-01-11 DIAGNOSIS — Z510 Encounter for antineoplastic radiation therapy: Secondary | ICD-10-CM | POA: Diagnosis not present

## 2017-01-11 MED ORDER — ONDANSETRON 4 MG PO TBDP: 4.0000 mg | ORAL_TABLET | Freq: Four times a day (QID) | ORAL | 0 refills | Status: AC | PRN

## 2017-01-12 ENCOUNTER — Ambulatory Visit
Admission: RE | Admit: 2017-01-12 | Discharge: 2017-01-12 | Disposition: A | Discharge status: Home / Self Care | Payer: Medicare Other | Source: Ambulatory Visit | Attending: Radiation Oncology | Admitting: Radiation Oncology

## 2017-01-12 DIAGNOSIS — Z510 Encounter for antineoplastic radiation therapy: Secondary | ICD-10-CM | POA: Diagnosis not present

## 2017-01-16 ENCOUNTER — Inpatient Hospital Stay: Payer: Medicare Other | Attending: Radiation Oncology

## 2017-01-16 ENCOUNTER — Ambulatory Visit
Admission: RE | Admit: 2017-01-16 | Discharge: 2017-01-16 | Disposition: A | Discharge status: Home / Self Care | Payer: Medicare Other | Source: Ambulatory Visit | Attending: Radiation Oncology | Admitting: Radiation Oncology

## 2017-01-16 ENCOUNTER — Telehealth: Payer: Self-pay | Admitting: *Deleted

## 2017-01-16 DIAGNOSIS — C7951 Secondary malignant neoplasm of bone: Secondary | ICD-10-CM | POA: Insufficient documentation

## 2017-01-16 DIAGNOSIS — C61 Malignant neoplasm of prostate: Secondary | ICD-10-CM | POA: Insufficient documentation

## 2017-01-16 DIAGNOSIS — Z510 Encounter for antineoplastic radiation therapy: Secondary | ICD-10-CM | POA: Diagnosis not present

## 2017-01-16 LAB — CBC
HCT: 23.8 % — ABNORMAL LOW (ref 40.0–52.0)
Hemoglobin: 8.4 g/dL — ABNORMAL LOW (ref 13.0–18.0)
MCH: 31.3 pg (ref 26.0–34.0)
MCHC: 35.3 g/dL (ref 32.0–36.0)
MCV: 88.4 fL (ref 80.0–100.0)
PLATELETS: 211 10*3/uL (ref 150–440)
RBC: 2.69 MIL/uL — ABNORMAL LOW (ref 4.40–5.90)
RDW: 14.6 % — ABNORMAL HIGH (ref 11.5–14.5)
WBC: 5.1 10*3/uL (ref 3.8–10.6)

## 2017-01-16 NOTE — Telephone Encounter (Signed)
Returned patient's call regarding pain and inability to get treatment due to pain.   Per Dr. Baruch Gouty, patient instructed to take an Oxycodone 10mg  30 minutes prior to treatment appointment.  Patient verbalized understanding.

## 2017-01-17 ENCOUNTER — Ambulatory Visit
Admission: RE | Admit: 2017-01-17 | Discharge: 2017-01-17 | Disposition: A | Discharge status: Home / Self Care | Payer: Medicare Other | Source: Ambulatory Visit | Attending: Radiation Oncology | Admitting: Radiation Oncology

## 2017-01-17 DIAGNOSIS — Z510 Encounter for antineoplastic radiation therapy: Secondary | ICD-10-CM | POA: Diagnosis not present

## 2017-01-18 ENCOUNTER — Ambulatory Visit
Admission: RE | Admit: 2017-01-18 | Discharge: 2017-01-18 | Disposition: A | Discharge status: Home / Self Care | Payer: Medicare Other | Source: Ambulatory Visit | Attending: Radiation Oncology | Admitting: Radiation Oncology

## 2017-01-18 DIAGNOSIS — Z510 Encounter for antineoplastic radiation therapy: Secondary | ICD-10-CM | POA: Diagnosis not present

## 2017-01-19 ENCOUNTER — Ambulatory Visit
Admission: RE | Admit: 2017-01-19 | Discharge: 2017-01-19 | Disposition: A | Discharge status: Home / Self Care | Payer: Medicare Other | Source: Ambulatory Visit | Attending: Radiation Oncology | Admitting: Radiation Oncology

## 2017-01-19 DIAGNOSIS — Z510 Encounter for antineoplastic radiation therapy: Secondary | ICD-10-CM | POA: Diagnosis not present

## 2017-01-22 ENCOUNTER — Ambulatory Visit
Admission: RE | Admit: 2017-01-22 | Discharge: 2017-01-22 | Disposition: A | Discharge status: Home / Self Care | Payer: Medicare Other | Source: Ambulatory Visit | Attending: Radiation Oncology | Admitting: Radiation Oncology

## 2017-01-22 DIAGNOSIS — Z510 Encounter for antineoplastic radiation therapy: Secondary | ICD-10-CM | POA: Diagnosis not present

## 2017-01-23 ENCOUNTER — Ambulatory Visit
Admission: RE | Admit: 2017-01-23 | Discharge: 2017-01-23 | Disposition: A | Discharge status: Home / Self Care | Payer: Medicare Other | Source: Ambulatory Visit | Attending: Radiation Oncology | Admitting: Radiation Oncology

## 2017-01-23 DIAGNOSIS — Z510 Encounter for antineoplastic radiation therapy: Secondary | ICD-10-CM | POA: Diagnosis not present

## 2017-01-24 ENCOUNTER — Ambulatory Visit: Payer: Medicare Other

## 2017-01-25 ENCOUNTER — Inpatient Hospital Stay: Payer: Medicare Other

## 2017-01-31 ENCOUNTER — Ambulatory Visit: Payer: Medicare Other | Admitting: Radiation Oncology

## 2017-01-31 ENCOUNTER — Ambulatory Visit: Payer: Medicare Other

## 2017-02-22 ENCOUNTER — Ambulatory Visit: Payer: Medicare Other | Admitting: Radiation Oncology

## 2017-02-28 ENCOUNTER — Encounter: Payer: Self-pay | Admitting: Radiation Oncology

## 2017-02-28 ENCOUNTER — Ambulatory Visit
Admission: RE | Admit: 2017-02-28 | Discharge: 2017-02-28 | Disposition: A | Discharge status: Home / Self Care | Payer: Self-pay | Source: Ambulatory Visit | Attending: Radiation Oncology | Admitting: Radiation Oncology

## 2017-02-28 ENCOUNTER — Ambulatory Visit
Admission: RE | Admit: 2017-02-28 | Discharge: 2017-02-28 | Disposition: A | Discharge status: Home / Self Care | Payer: Medicare Other | Source: Ambulatory Visit | Attending: Radiation Oncology | Admitting: Radiation Oncology

## 2017-02-28 ENCOUNTER — Other Ambulatory Visit: Payer: Self-pay | Admitting: Radiation Oncology

## 2017-02-28 VITALS — BP 108/59 | HR 87 | Temp 96.9°F | Wt 168.0 lb

## 2017-02-28 DIAGNOSIS — C61 Malignant neoplasm of prostate: Secondary | ICD-10-CM

## 2017-02-28 DIAGNOSIS — C7951 Secondary malignant neoplasm of bone: Secondary | ICD-10-CM

## 2017-03-01 NOTE — Progress Notes (Signed)
Radiation Oncology Old patient new area note  Name: Aaron Ferrell   Date:   02/28/2017 MRN:  [id number] DOB: 21-Apr-1951    This 66 y.o. 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  REFERRING PROVIDER: [name], MD  HPI: Patient is a 66 year old male well known to our department had a partial course ofXofigo. He has presented with rapidly increasing metastatic tumor burden most recently. Patient is currently being treated with chemotherapy and is had a response to therapy with improvement his LDH and ALP. Unfortunately had a fall was admitted to [facility] MRI scan of head showing a 5 cm mass invading into the right temporal lobe without significant effect and mass effect on the brainstem. There is also compression of the right lateral ventricle.The lesion is enhancing and is contiguous to an avidly enhancing sclerotic osseous lesion of the right orbitozygomatic process and great sphenoid wing with involvement of the lateral wall of the right orbit. Surgical intervention has been proposed although patient is hesitant with his limited life expectancy to undergo such extensive surgery. He is seen today for consideration of palliative treatment. Patient is in significant pain in his head and neck region.  COMPLICATIONS OF TREATMENT: none  FOLLOW UP COMPLIANCE: keeps appointments   PHYSICAL EXAM:  BP (!) 108/59   Pulse 87   Temp (!) 96.9 F (36.1 C)   Wt 168 lb (76.2 kg)   BMI 26.31 kg/m  Frail-appearing wheelchair-bound male. Range of motion of the neck does elicit some pain. Otherwise neurologic examination is intact. Well-developed well-nourished patient in NAD. HEENT reveals PERLA, EOMI, discs not visualized.  Oral cavity is clear. No oral mucosal lesions are identified. Neck is clear without evidence of cervical or supraclavicular adenopathy. Lungs are clear to A&P.  Cardiac examination is essentially unremarkable with regular rate and rhythm without murmur rub or thrill. Abdomen is benign with no organomegaly or masses noted. Motor sensory and DTR levels are equal and symmetric in the upper and lower extremities. Cranial nerves II through XII are grossly intact. Proprioception is intact. No peripheral adenopathy or edema is identified. No motor or sensory levels are noted. Crude visual fields are within normal range.  RADIOLOGY RESULTS: MRI scans are reviewed and compatible with the above-stated findings. Patient is scheduled for a second MRI scan of the brain tomorrow which we will retrieve for our archives.  PLAN: At this time we had a long discussion about his life expectancy and the significant of major brain surgery at this time. I have performed proposed palliative radiation therapy to this lesion. Would try to treat in a hypofractionated palliative mode. Would plan on delivering 2400 cGy in 4 fractions. I have personally set up and ordered CT simulation for early next week. Would like to review his new MRI scan when available. Patient will have discussion with medical oncologist about my treatment recommendations. Wife and patient seem to compress my treatment plan well.  I would like to take this opportunity to thank you for allowing me to participate in the care of your patient.Armstead Peaks., MD

## 2017-03-06 ENCOUNTER — Ambulatory Visit: Admission: RE | Admit: 2017-03-06 | Payer: Medicare Other | Source: Ambulatory Visit

## 2017-03-27 ENCOUNTER — Other Ambulatory Visit: Payer: Self-pay | Admitting: Radiation Oncology

## 2017-03-27 ENCOUNTER — Ambulatory Visit
Admission: RE | Admit: 2017-03-27 | Discharge: 2017-03-27 | Disposition: A | Discharge status: Home / Self Care | Payer: Self-pay | Source: Ambulatory Visit | Attending: Radiation Oncology | Admitting: Radiation Oncology

## 2017-03-27 ENCOUNTER — Ambulatory Visit: Payer: Medicare Other

## 2017-03-27 ENCOUNTER — Ambulatory Visit: Payer: Medicare Other | Admitting: Radiation Oncology

## 2017-03-27 DIAGNOSIS — C61 Malignant neoplasm of prostate: Secondary | ICD-10-CM

## 2017-03-27 DIAGNOSIS — C7951 Secondary malignant neoplasm of bone: Principal | ICD-10-CM

## 2017-04-01 IMAGING — NM NM BONE WHOLE BODY
2 series · 10 of 10 positions shown · non-contrast
Comparison: CT 09/05/2010.  Bone scan 05/13/2010.

CLINICAL DATA: Prostate cancer.

EXAM:
NUCLEAR MEDICINE WHOLE BODY BONE SCAN
TECHNIQUE: Whole body anterior and posterior images were obtained approximately
3 hours after intravenous injection of radiopharmaceutical.
RADIOPHARMACEUTICALS:  23.96 mCi Fechnetium-LLm MDP IV

[Series 1000: statics · 2.40mm/px · 4 acquisitions, 8 frames shown]
[im 1/4]
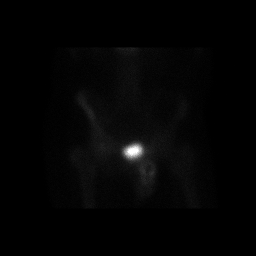
[im 1/4]
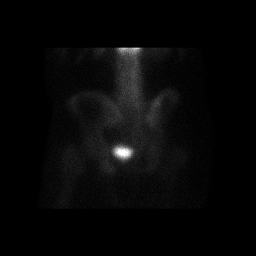
[im 2/4]
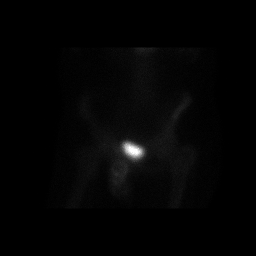
[im 2/4]
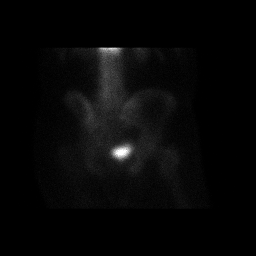
[im 3/4]
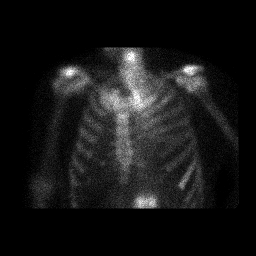
[im 3/4]
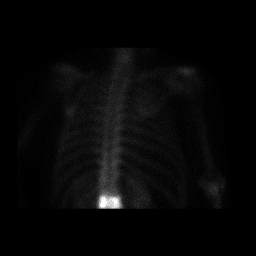
[im 4/4]
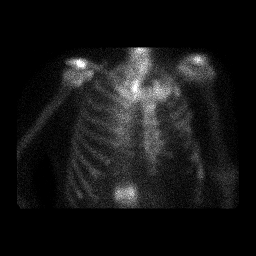
[im 4/4]
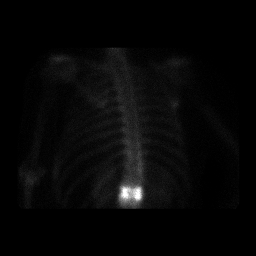

[Series 1000: 3 hr wholebody · 2.40mm/px · 2 of 2 frames shown]
[frame 1/2]
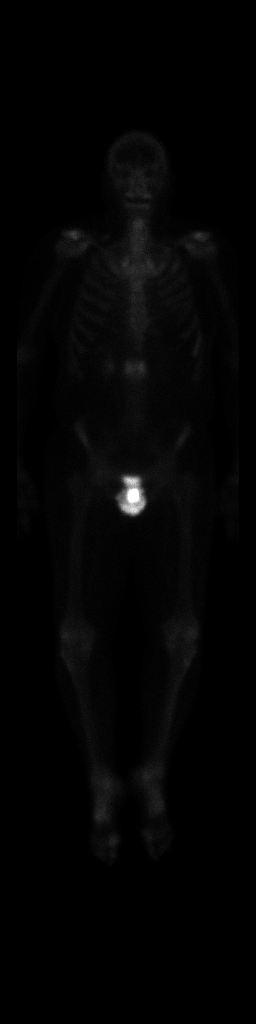
[frame 2/2]
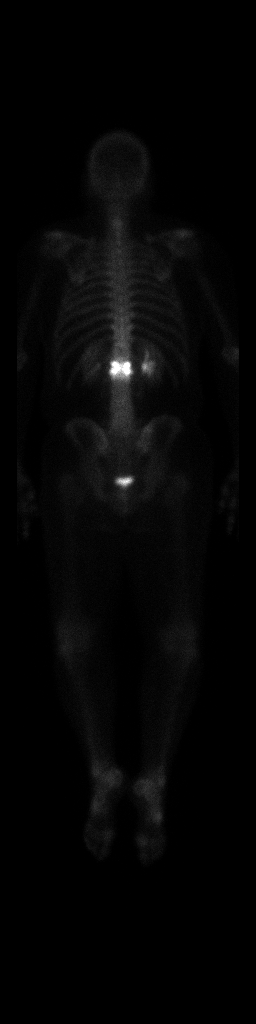

[10 of 10 positions shown; findings below may reference images not displayed]

FINDINGS: Bilateral renal function excretion. Prominent increased activity
noted over the L1 region. Gadolinium-enhanced MRI of the lumbar
spine should be considered for further evaluation. Compression
fracture could present in this fashion. And although metastatic
disease cannot be excluded, no other focal abnormalities are noted
to suggest metastatic disease.
IMPRESSION: Intense increased activity in the L1 region. Gadolinium-enhanced MRI
of the lumbar spine suggested for further evaluation.

## 2017-08-21 DEATH — deceased
